# Patient Record
Sex: Male | Born: 1955 | Race: White | Hispanic: No | Marital: Married | Smoking: Former smoker
Health system: Southern US, Community
[De-identification: ages and names within clinical notes are randomized; demographics above are authoritative.]

## PROBLEM LIST (undated history)

## (undated) DIAGNOSIS — M1990 Unspecified osteoarthritis, unspecified site: Secondary | ICD-10-CM

## (undated) DIAGNOSIS — E119 Type 2 diabetes mellitus without complications: Secondary | ICD-10-CM

## (undated) DIAGNOSIS — E781 Pure hyperglyceridemia: Secondary | ICD-10-CM

## (undated) DIAGNOSIS — Z87442 Personal history of urinary calculi: Secondary | ICD-10-CM

## (undated) HISTORY — DX: Pure hyperglyceridemia: E78.1

## (undated) HISTORY — DX: Unspecified osteoarthritis, unspecified site: M19.90

## (undated) HISTORY — DX: Personal history of urinary calculi: Z87.442

## (undated) HISTORY — DX: Type 2 diabetes mellitus without complications: E11.9

## (undated) HISTORY — PX: KIDNEY STONE SURGERY: SHX686

---

## 2016-07-19 DIAGNOSIS — E781 Pure hyperglyceridemia: Secondary | ICD-10-CM

## 2016-07-19 HISTORY — DX: Pure hyperglyceridemia: E78.1

## 2016-07-31 ENCOUNTER — Ambulatory Visit (INDEPENDENT_AMBULATORY_CARE_PROVIDER_SITE_OTHER): Payer: BLUE CROSS/BLUE SHIELD | Admitting: Family Medicine

## 2016-07-31 ENCOUNTER — Encounter: Payer: Self-pay | Admitting: Family Medicine

## 2016-07-31 VITALS — BP 129/86 | HR 67 | Temp 98.1°F | Resp 16 | Ht 71.0 in | Wt 215.5 lb

## 2016-07-31 DIAGNOSIS — Z0000 Encounter for general adult medical examination without abnormal findings: Secondary | ICD-10-CM

## 2016-07-31 DIAGNOSIS — Z1159 Encounter for screening for other viral diseases: Secondary | ICD-10-CM

## 2016-07-31 DIAGNOSIS — Z125 Encounter for screening for malignant neoplasm of prostate: Secondary | ICD-10-CM

## 2016-07-31 DIAGNOSIS — Z1211 Encounter for screening for malignant neoplasm of colon: Secondary | ICD-10-CM | POA: Diagnosis not present

## 2016-07-31 LAB — COMPREHENSIVE METABOLIC PANEL
ALT: 26 U/L (ref 0–53)
AST: 16 U/L (ref 0–37)
Albumin: 4.5 g/dL (ref 3.5–5.2)
Alkaline Phosphatase: 83 U/L (ref 39–117)
BILIRUBIN TOTAL: 0.6 mg/dL (ref 0.2–1.2)
BUN: 19 mg/dL (ref 6–23)
CO2: 28 meq/L (ref 19–32)
CREATININE: 0.87 mg/dL (ref 0.40–1.50)
Calcium: 9.7 mg/dL (ref 8.4–10.5)
Chloride: 102 mEq/L (ref 96–112)
GFR: 94.67 mL/min (ref >60.00)
GLUCOSE: 120 mg/dL — AB (ref 70–99)
Potassium: 4.2 mEq/L (ref 3.5–5.1)
SODIUM: 138 meq/L (ref 135–145)
Total Protein: 7.2 g/dL (ref 6.0–8.3)

## 2016-07-31 LAB — CBC WITH DIFFERENTIAL/PLATELET
BASOS ABS: 0 10*3/uL (ref 0.0–0.1)
Basophils Relative: 0.6 % (ref 0.0–3.0)
EOS ABS: 0.1 10*3/uL (ref 0.0–0.7)
Eosinophils Relative: 2.2 % (ref 0.0–5.0)
HEMATOCRIT: 46.8 % (ref 39.0–52.0)
Hemoglobin: 16.1 g/dL (ref 13.0–17.0)
LYMPHS PCT: 32.1 % (ref 12.0–46.0)
Lymphs Abs: 1.9 10*3/uL (ref 0.7–4.0)
MCHC: 34.4 g/dL (ref 30.0–36.0)
MCV: 89.7 fl (ref 78.0–100.0)
MONO ABS: 0.5 10*3/uL (ref 0.1–1.0)
Monocytes Relative: 9.2 % (ref 3.0–12.0)
NEUTROS ABS: 3.3 10*3/uL (ref 1.4–7.7)
NEUTROS PCT: 55.9 % (ref 43.0–77.0)
PLATELETS: 192 10*3/uL (ref 150.0–400.0)
RBC: 5.21 Mil/uL (ref 4.22–5.81)
RDW: 13.1 % (ref 11.5–15.5)
WBC: 5.9 10*3/uL (ref 4.0–10.5)

## 2016-07-31 LAB — LIPID PANEL
CHOL/HDL RATIO: 4
Cholesterol: 191 mg/dL (ref 0–200)
HDL: 48.8 mg/dL (ref >39.00)
NONHDL: 142.06
Triglycerides: 255 mg/dL — ABNORMAL HIGH (ref 0.0–149.0)
VLDL: 51 mg/dL — ABNORMAL HIGH (ref 0.0–40.0)

## 2016-07-31 LAB — LDL CHOLESTEROL, DIRECT: LDL DIRECT: 96 mg/dL

## 2016-07-31 LAB — PSA: PSA: 0.6 ng/mL (ref 0.10–4.00)

## 2016-07-31 LAB — TSH: TSH: 2.22 u[IU]/mL (ref 0.35–4.50)

## 2016-07-31 NOTE — Progress Notes (Signed)
Pre visit review using our clinic review tool, if applicable. No additional management support is needed unless otherwise documented below in the visit note. 

## 2016-07-31 NOTE — Progress Notes (Signed)
Office Note 07/31/2016  CC:  Chief Complaint  Patient presents with  . Establish Care    pt moved to [facility] in May of 2017, last PCP was Dr. Jae Dire in [place], [place]  . Annual Exam    Pt is fasting.     HPI:  Joseph Cummings is a 61 y.o. White male who is here to establish care. Patient's most recent primary MD: Dr. Jae Dire in [place], [place]. Old records were not reviewed prior to or during today's visit.  No acute complaints.  Needs to get established with a local dentist.  Doesn't exercise much. Says diet is "pretty good".  Past Medical History:  Diagnosis Date  . Arthritis   . History of kidney stones     Past Surgical History:  Procedure Laterality Date  . KIDNEY STONE SURGERY     ureteroscopy with extraction approx 2015    Family History  Problem Relation Age of Onset  . Hyperlipidemia Mother   . Hypertension Mother   . Arthritis Father   . Hyperlipidemia Father   . Hypertension Father   . Heart disease Father   . Stroke Neg Hx   . Diabetes Neg Hx   . Cancer Neg Hx     Social History   Social History  . Marital status: Married    Spouse name: N/A  . Number of children: N/A  . Years of education: N/A   Occupational History  . Not on file.   Social History Main Topics  . Smoking status: Former Smoker    Packs/day: 1.00    Years: 10.00    Types: Cigarettes    Quit date: 06/19/1991  . Smokeless tobacco: Never Used  . Alcohol use Yes     Comment: occasionally  . Drug use: No  . Sexual activity: Not on file   Other Topics Concern  . Not on file   Social History Narrative   Married, 3 children.d   Educ: HS   Occup: retired Ecologist.     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   No tobacco (quit 25 yrs ago).  Alcohol: rare/occ.    Outpatient Encounter Prescriptions as of 07/31/2016  Medication Sig  . Omega-3 Fatty Acids (FISH OIL) 1000 MG CAPS Take 1 capsule by mouth daily.   No facility-administered encounter medications on file as of  07/31/2016.     Allergies  Allergen Reactions  . Sulfa Antibiotics Anaphylaxis    ROS Review of Systems  Constitutional: Negative for appetite change, chills, fatigue and fever.  HENT: Negative for congestion, dental problem, ear pain and sore throat.   Eyes: Negative for discharge, redness and visual disturbance.  Respiratory: Negative for cough, chest tightness, shortness of breath and wheezing.   Cardiovascular: Negative for chest pain, palpitations and leg swelling.  Gastrointestinal: Negative for abdominal pain, blood in stool, diarrhea, nausea and vomiting.  Genitourinary: Negative for difficulty urinating, dysuria, flank pain, frequency, hematuria and urgency.  Musculoskeletal: Negative for arthralgias, back pain, joint swelling, myalgias and neck stiffness.  Skin: Negative for pallor and rash.  Neurological: Negative for dizziness, speech difficulty, weakness and headaches.  Hematological: Negative for adenopathy. Does not bruise/bleed easily.  Psychiatric/Behavioral: Negative for confusion and sleep disturbance. The patient is not nervous/anxious.     PE; Blood pressure 129/86, pulse 67, temperature 98.1 F (36.7 C), temperature source Oral, resp. rate 16, height 5\' 11"  (1.803 m), weight 215 lb 8 oz (97.8 kg), SpO2 94 %. Gen: Alert, well appearing.  Patient is  oriented to person, place, time, and situation. AFFECT: pleasant, lucid thought and speech. ENT: Ears: EACs clear, normal epithelium.  TMs with good light reflex and landmarks bilaterally.  Eyes: no injection, icteris, swelling, or exudate.  EOMI, PERRLA. Nose: no drainage or turbinate edema/swelling.  No injection or focal lesion.  Mouth: lips without lesion/swelling.  Oral mucosa pink and moist.  Dentition intact and without obvious caries or gingival swelling.  Oropharynx without erythema, exudate, or swelling.  Neck: supple/nontender.  No LAD, mass, or TM.  Carotid pulses 2+ bilaterally, without bruits. CV: RRR, no  m/r/g.   LUNGS: CTA bilat, nonlabored resps, good aeration in all lung fields. ABD: soft, NT, ND, BS normal.  No hepatospenomegaly or mass.  No bruits. EXT: no clubbing, cyanosis, or edema.  Musculoskeletal: no joint swelling, erythema, warmth, or tenderness.  ROM of all joints intact. Skin - no sores or suspicious lesions or rashes or color changes Rectal exam: negative without mass, lesions or tenderness, PROSTATE EXAM: smooth and symmetric without nodules or tenderness.  Pertinent labs:  None today  ASSESSMENT AND PLAN:   New pt; obtain prior PCP records.  Health maintenance exam: Reviewed age and gender appropriate health maintenance issues (prudent diet, regular exercise, health risks of tobacco and excessive alcohol, use of seatbelts, fire alarms in home, use of sunscreen).  Also reviewed age and gender appropriate health screening as well as vaccine recommendations. He declined flu vaccine today.  Says last Td was 1 yr ago.  Zostavax rx given to pt today to take to pharmacy. Prostate: DRE normal, PSA drawn today. Colon cancer screening: pt declines colonoscopy and chose to do iFOB/Hemoccult ICT testing today.  An After Visit Summary was printed and given to the patient.  Return in about 1 year (around 07/31/2017) for annual CPE (fasting).  Signed:  Crissie Sickles, MD           07/31/2016

## 2016-08-01 ENCOUNTER — Encounter: Payer: Self-pay | Admitting: Family Medicine

## 2016-08-01 ENCOUNTER — Other Ambulatory Visit (INDEPENDENT_AMBULATORY_CARE_PROVIDER_SITE_OTHER): Payer: BLUE CROSS/BLUE SHIELD

## 2016-08-01 DIAGNOSIS — R7301 Impaired fasting glucose: Secondary | ICD-10-CM | POA: Diagnosis not present

## 2016-08-01 LAB — HEMOGLOBIN A1C: Hgb A1c MFr Bld: 6.1 % (ref 4.6–6.5)

## 2016-08-01 LAB — HEPATITIS C ANTIBODY: HCV Ab: NEGATIVE

## 2016-10-31 ENCOUNTER — Encounter: Payer: Self-pay | Admitting: Family Medicine

## 2016-10-31 ENCOUNTER — Ambulatory Visit (INDEPENDENT_AMBULATORY_CARE_PROVIDER_SITE_OTHER): Payer: 59 | Admitting: Family Medicine

## 2016-10-31 VITALS — BP 107/76 | HR 76 | Temp 99.0°F | Resp 16 | Ht 71.0 in | Wt 218.5 lb

## 2016-10-31 DIAGNOSIS — J209 Acute bronchitis, unspecified: Secondary | ICD-10-CM

## 2016-10-31 DIAGNOSIS — J069 Acute upper respiratory infection, unspecified: Secondary | ICD-10-CM | POA: Diagnosis not present

## 2016-10-31 MED ORDER — AZITHROMYCIN 250 MG PO TABS: ORAL_TABLET | ORAL | 0 refills | Status: DC

## 2016-10-31 MED ORDER — PREDNISONE 20 MG PO TABS: ORAL_TABLET | ORAL | 0 refills | Status: DC

## 2016-10-31 NOTE — Progress Notes (Signed)
OFFICE VISIT  10/31/2016   CC:  Chief Complaint  Patient presents with  . URI    x 3 days   HPI:    Patient is a 61 y.o.  male who presents for respiratory symptoms. Onset about 3 days ago, nasal congestion, runny nose, sinus pressure, PND, hacking cough. No SOB but says some chest rattle.  No fevers.  No body aches. Eating and drinking fine.   Past Medical History:  Diagnosis Date  . Arthritis   . History of kidney stones   . Hypertriglyceridemia 07/2016   mild  . Prediabetes 07/2016   Fasting gluc 120.  HbA1c 6.1%.    Past Surgical History:  Procedure Laterality Date  . KIDNEY STONE SURGERY     ureteroscopy with extraction approx 2015    Outpatient Medications Prior to Visit  Medication Sig Dispense Refill  . Omega-3 Fatty Acids (FISH OIL) 1000 MG CAPS Take 1 capsule by mouth daily.     No facility-administered medications prior to visit.     Allergies  Allergen Reactions  . Sulfa Antibiotics Anaphylaxis    ROS As per HPI  PE: Blood pressure 107/76, pulse 76, temperature 99 F (37.2 C), temperature source Oral, resp. rate 16, height 5\' 11"  (1.803 m), weight 218 lb 8 oz (99.1 kg), SpO2 95 %. VS: noted--normal. Gen: alert, NAD, NONTOXIC APPEARING. HEENT: eyes without injection, drainage, or swelling.  Ears: EACs clear, TMs with normal light reflex and landmarks.  Nose: Clear rhinorrhea, with some dried, crusty exudate adherent to mildly injected mucosa.  No purulent d/c.  No paranasal sinus TTP.  No facial swelling.  Throat and mouth without focal lesion.  No pharyngial swelling, erythema, or exudate.   Neck: supple, no LAD.   LUNGS: CTA bilat, nonlabored resps.  Some excessive coughing with deep inhalation and forced exhalation. CV: RRR, no m/r/g. EXT: no c/c/e SKIN: no rash    LABS:    Chemistry      Component Value Date/Time   NA 138 07/31/2016 1035   K 4.2 07/31/2016 1035   CL 102 07/31/2016 1035   CO2 28 07/31/2016 1035   BUN 19 07/31/2016  1035   CREATININE 0.87 07/31/2016 1035      Component Value Date/Time   CALCIUM 9.7 07/31/2016 1035   ALKPHOS 83 07/31/2016 1035   AST 16 07/31/2016 1035   ALT 26 07/31/2016 1035   BILITOT 0.6 07/31/2016 1035       IMPRESSION AND PLAN:  URI with acute bronchitis. Discussed the fact that this is likely viral, but due to pt's past hx of bad experience with this type of illness "going into chest too much and needing a Z-pack", I decided to rx z-pack and let him know of potential lack of efficacy if this is viral etiology AND he would be at risk for the potential risks of abx--he expressed understanding and wanted to have z-pack. I also rx'd prednisone 40mg  qd x 5d.  I recommended albuterol HFA but he says it has not helped for him in the past with this kind of illness.  An After Visit Summary was printed and given to the patient.  FOLLOW UP: Return if symptoms worsen or fail to improve.  Signed:  Crissie Sickles, MD           10/31/2016

## 2016-11-10 DIAGNOSIS — R229 Localized swelling, mass and lump, unspecified: Secondary | ICD-10-CM | POA: Diagnosis not present

## 2017-02-01 ENCOUNTER — Ambulatory Visit: Payer: 59 | Admitting: Family Medicine

## 2017-02-01 NOTE — Progress Notes (Deleted)
OFFICE VISIT  02/01/2017   CC: No chief complaint on file.    HPI:    Patient is a 61 y.o.  male who presents for 6 mo f/u prediabetes  Past Medical History:  Diagnosis Date  . Arthritis   . History of kidney stones   . Hypertriglyceridemia 07/2016   mild  . Prediabetes 07/2016   Fasting gluc 120.  HbA1c 6.1%.    Past Surgical History:  Procedure Laterality Date  . KIDNEY STONE SURGERY     ureteroscopy with extraction approx 2015    Outpatient Medications Prior to Visit  Medication Sig Dispense Refill  . azithromycin (ZITHROMAX) 250 MG tablet 2 tabs po qd x 1d, then 1 tab po qd x 4d 6 tablet 0  . Omega-3 Fatty Acids (FISH OIL) 1000 MG CAPS Take 1 capsule by mouth daily.    . predniSONE (DELTASONE) 20 MG tablet 2 tabs po qd x 5d 10 tablet 0   No facility-administered medications prior to visit.     Allergies  Allergen Reactions  . Sulfa Antibiotics Anaphylaxis    ROS As per HPI  PE: There were no vitals taken for this visit. ***  LABS:  Lab Results  Component Value Date   TSH 2.22 07/31/2016   Lab Results  Component Value Date   WBC 5.9 07/31/2016   HGB 16.1 07/31/2016   HCT 46.8 07/31/2016   MCV 89.7 07/31/2016   PLT 192.0 07/31/2016   Lab Results  Component Value Date   CREATININE 0.87 07/31/2016   BUN 19 07/31/2016   NA 138 07/31/2016   K 4.2 07/31/2016   CL 102 07/31/2016   CO2 28 07/31/2016   Lab Results  Component Value Date   ALT 26 07/31/2016   AST 16 07/31/2016   ALKPHOS 83 07/31/2016   BILITOT 0.6 07/31/2016   Lab Results  Component Value Date   CHOL 191 07/31/2016   Lab Results  Component Value Date   HDL 48.80 07/31/2016   No results found for: [facility] Lab Results  Component Value Date   TRIG 255.0 (H) 07/31/2016   Lab Results  Component Value Date   CHOLHDL 4 07/31/2016   Lab Results  Component Value Date   PSA 0.60 07/31/2016   Lab Results  Component Value Date   HGBA1C 6.1 08/01/2016   IMPRESSION AND  PLAN:  No problem-specific Assessment & Plan notes found for this encounter.  Hba1c, gluc + lipid if fasting  An After Visit Summary was printed and given to the patient.  FOLLOW UP: No Follow-up on file.  Signed:  Crissie Sickles, MD           02/01/2017

## 2017-03-31 DIAGNOSIS — T07XXXA Unspecified multiple injuries, initial encounter: Secondary | ICD-10-CM | POA: Diagnosis not present

## 2017-08-01 ENCOUNTER — Encounter: Payer: BLUE CROSS/BLUE SHIELD | Admitting: Family Medicine

## 2017-08-07 DIAGNOSIS — S29012A Strain of muscle and tendon of back wall of thorax, initial encounter: Secondary | ICD-10-CM | POA: Diagnosis not present

## 2017-08-07 DIAGNOSIS — M62838 Other muscle spasm: Secondary | ICD-10-CM | POA: Diagnosis not present

## 2018-01-16 DIAGNOSIS — E119 Type 2 diabetes mellitus without complications: Secondary | ICD-10-CM

## 2018-01-16 HISTORY — DX: Type 2 diabetes mellitus without complications: E11.9

## 2018-01-17 ENCOUNTER — Encounter: Payer: Self-pay | Admitting: Family Medicine

## 2018-01-17 ENCOUNTER — Ambulatory Visit (INDEPENDENT_AMBULATORY_CARE_PROVIDER_SITE_OTHER): Payer: 59 | Admitting: Family Medicine

## 2018-01-17 VITALS — BP 142/84 | HR 64 | Temp 97.7°F | Resp 16 | Ht 71.0 in | Wt 219.0 lb

## 2018-01-17 DIAGNOSIS — Z1211 Encounter for screening for malignant neoplasm of colon: Secondary | ICD-10-CM

## 2018-01-17 DIAGNOSIS — Z0000 Encounter for general adult medical examination without abnormal findings: Secondary | ICD-10-CM | POA: Diagnosis not present

## 2018-01-17 DIAGNOSIS — R7303 Prediabetes: Secondary | ICD-10-CM

## 2018-01-17 DIAGNOSIS — Z125 Encounter for screening for malignant neoplasm of prostate: Secondary | ICD-10-CM | POA: Diagnosis not present

## 2018-01-17 DIAGNOSIS — E781 Pure hyperglyceridemia: Secondary | ICD-10-CM

## 2018-01-17 LAB — CBC WITH DIFFERENTIAL/PLATELET
Basophils Absolute: 0 10*3/uL (ref 0.0–0.1)
Basophils Relative: 0.5 % (ref 0.0–3.0)
Eosinophils Absolute: 0.1 10*3/uL (ref 0.0–0.7)
Eosinophils Relative: 1.1 % (ref 0.0–5.0)
HEMATOCRIT: 46.5 % (ref 39.0–52.0)
HEMOGLOBIN: 16.1 g/dL (ref 13.0–17.0)
LYMPHS PCT: 27.4 % (ref 12.0–46.0)
Lymphs Abs: 1.8 10*3/uL (ref 0.7–4.0)
MCHC: 34.7 g/dL (ref 30.0–36.0)
MCV: 90.2 fl (ref 78.0–100.0)
MONOS PCT: 9.8 % (ref 3.0–12.0)
Monocytes Absolute: 0.6 10*3/uL (ref 0.1–1.0)
NEUTROS ABS: 4 10*3/uL (ref 1.4–7.7)
Neutrophils Relative %: 61.2 % (ref 43.0–77.0)
PLATELETS: 190 10*3/uL (ref 150.0–400.0)
RBC: 5.16 Mil/uL (ref 4.22–5.81)
RDW: 13.2 % (ref 11.5–15.5)
WBC: 6.6 10*3/uL (ref 4.0–10.5)

## 2018-01-17 LAB — COMPREHENSIVE METABOLIC PANEL WITH GFR
ALT: 25 U/L (ref 0–53)
AST: 17 U/L (ref 0–37)
Albumin: 4.6 g/dL (ref 3.5–5.2)
Alkaline Phosphatase: 70 U/L (ref 39–117)
BUN: 12 mg/dL (ref 6–23)
CO2: 28 meq/L (ref 19–32)
Calcium: 9.7 mg/dL (ref 8.4–10.5)
Chloride: 103 meq/L (ref 96–112)
Creatinine, Ser: 0.95 mg/dL (ref 0.40–1.50)
GFR: 85.13 mL/min
Glucose, Bld: 101 mg/dL — ABNORMAL HIGH (ref 70–99)
Potassium: 4 meq/L (ref 3.5–5.1)
Sodium: 139 meq/L (ref 135–145)
Total Bilirubin: 1.1 mg/dL (ref 0.2–1.2)
Total Protein: 7.3 g/dL (ref 6.0–8.3)

## 2018-01-17 LAB — LDL CHOLESTEROL, DIRECT: Direct LDL: 85 mg/dL

## 2018-01-17 LAB — LIPID PANEL
Cholesterol: 161 mg/dL (ref 0–200)
HDL: 52.4 mg/dL
NonHDL: 108.39
Total CHOL/HDL Ratio: 3
Triglycerides: 206 mg/dL — ABNORMAL HIGH (ref 0.0–149.0)
VLDL: 41.2 mg/dL — ABNORMAL HIGH (ref 0.0–40.0)

## 2018-01-17 LAB — HEMOGLOBIN A1C: Hgb A1c MFr Bld: 6.5 % (ref 4.6–6.5)

## 2018-01-17 LAB — TSH: TSH: 1.21 u[IU]/mL (ref 0.35–4.50)

## 2018-01-17 LAB — PSA: PSA: 0.37 ng/mL (ref 0.10–4.00)

## 2018-01-17 NOTE — Patient Instructions (Signed)

## 2018-01-17 NOTE — Progress Notes (Signed)
Office Note 01/17/2018  CC:  Chief Complaint  Patient presents with  . Annual Exam    HPI:  Joseph Cummings is a 62 y.o. White male who is here for annual health maintenance exam. Doing well.  Exercising: walking, sit ups, push ups. Diet: pretty healthy. Dental: > 43yr   Past Medical History:  Diagnosis Date  . Arthritis   . History of kidney stones   . Hypertriglyceridemia 07/2016   mild  . Prediabetes 07/2016   Fasting gluc 120.  HbA1c 6.1%.    Past Surgical History:  Procedure Laterality Date  . KIDNEY STONE SURGERY     ureteroscopy with extraction approx 2015    Family History  Problem Relation Age of Onset  . Hyperlipidemia Mother   . Hypertension Mother   . Arthritis Father   . Hyperlipidemia Father   . Hypertension Father   . Heart disease Father   . Stroke Neg Hx   . Diabetes Neg Hx   . Cancer Neg Hx     Social History   Socioeconomic History  . Marital status: Married    Spouse name: Not on file  . Number of children: Not on file  . Years of education: Not on file  . Highest education level: Not on file  Occupational History  . Not on file  Social Needs  . Financial resource strain: Not on file  . Food insecurity:    Worry: Not on file    Inability: Not on file  . Transportation needs:    Medical: Not on file    Non-medical: Not on file  Tobacco Use  . Smoking status: Former Smoker    Packs/day: 1.00    Years: 10.00    Pack years: 10.00    Types: Cigarettes    Last attempt to quit: 06/19/1991    Years since quitting: 26.6  . Smokeless tobacco: Never Used  Substance and Sexual Activity  . Alcohol use: Yes    Comment: occasionally  . Drug use: No  . Sexual activity: Not on file  Lifestyle  . Physical activity:    Days per week: Not on file    Minutes per session: Not on file  . Stress: Not on file  Relationships  . Social connections:    Talks on phone: Not on file    Gets together: Not on file    Attends religious service:  Not on file    Active member of club or organization: Not on file    Attends meetings of clubs or organizations: Not on file    Relationship status: Not on file  . Intimate partner violence:    Fear of current or ex partner: Not on file    Emotionally abused: Not on file    Physically abused: Not on file    Forced sexual activity: Not on file  Other Topics Concern  . Not on file  Social History Narrative   Married, 3 children.d   Educ: HS   Occup: retired Ecologist.     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   No tobacco (quit 25 yrs ago).  Alcohol: rare/occ.    Outpatient Medications Prior to Visit  Medication Sig Dispense Refill  . omega-3 acid ethyl esters (LOVAZA) 1 g capsule Take by mouth.    . Omega-3 Fatty Acids (FISH OIL) 1000 MG CAPS Take 1 capsule by mouth daily.    Marland Kitchen pyridOXINE (B-6) 50 MG tablet Take by mouth.    . Zinc Citrate-Phytase (ZYTAZE)  25-500 MG CAPS Take by mouth.    . predniSONE (DELTASONE) 20 MG tablet 2 tabs po qd x 5d (Patient not taking: Reported on 01/17/2018) 10 tablet 0  . azithromycin (ZITHROMAX) 250 MG tablet 2 tabs po qd x 1d, then 1 tab po qd x 4d 6 tablet 0   No facility-administered medications prior to visit.     Allergies  Allergen Reactions  . Sulfa Antibiotics Anaphylaxis    ROS Review of Systems  Constitutional: Negative for appetite change, chills, fatigue and fever.  HENT: Negative for congestion, dental problem, ear pain and sore throat.   Eyes: Negative for discharge, redness and visual disturbance.  Respiratory: Negative for cough, chest tightness, shortness of breath and wheezing.   Cardiovascular: Negative for chest pain, palpitations and leg swelling.  Gastrointestinal: Negative for abdominal pain, blood in stool, diarrhea, nausea and vomiting.  Genitourinary: Negative for difficulty urinating, dysuria, flank pain, frequency, hematuria and urgency.  Musculoskeletal: Negative for arthralgias, back pain, joint swelling, myalgias  and neck stiffness.  Skin: Negative for pallor and rash.  Neurological: Negative for dizziness, speech difficulty, weakness and headaches.  Hematological: Negative for adenopathy. Does not bruise/bleed easily.  Psychiatric/Behavioral: Negative for confusion and sleep disturbance. The patient is not nervous/anxious.     PE; Blood pressure (!) 142/84, pulse 64, temperature 97.7 F (36.5 C), temperature source Oral, resp. rate 16, height 5\' 11"  (1.803 m), weight 219 lb (99.3 kg), SpO2 96 %. Gen: Alert, well appearing.  Patient is oriented to person, place, time, and situation. AFFECT: pleasant, lucid thought and speech. ENT: Ears: EACs clear, normal epithelium.  TMs with good light reflex and landmarks bilaterally.  Eyes: no injection, icteris, swelling, or exudate.  EOMI, PERRLA. Nose: no drainage or turbinate edema/swelling.  No injection or focal lesion.  Mouth: lips without lesion/swelling.  Oral mucosa pink and moist.  Dentition intact and without obvious caries or gingival swelling.  Oropharynx without erythema, exudate, or swelling.  Neck: supple/nontender.  No LAD, mass, or TM.  Carotid pulses 2+ bilaterally, without bruits. CV: RRR, no m/r/g.   LUNGS: CTA bilat, nonlabored resps, good aeration in all lung fields. ABD: soft, NT, ND, BS normal.  No hepatospenomegaly or mass.  No bruits. EXT: no clubbing, cyanosis, or edema.  Musculoskeletal: no joint swelling, erythema, warmth, or tenderness.  ROM of all joints intact. Skin - no sores or suspicious lesions or rashes or color changes Rectal exam: negative without mass, lesions or tenderness, PROSTATE EXAM: smooth and symmetric without nodules or tenderness.   Pertinent labs:  Lab Results  Component Value Date   TSH 2.22 07/31/2016   Lab Results  Component Value Date   WBC 5.9 07/31/2016   HGB 16.1 07/31/2016   HCT 46.8 07/31/2016   MCV 89.7 07/31/2016   PLT 192.0 07/31/2016   Lab Results  Component Value Date   CREATININE  0.87 07/31/2016   BUN 19 07/31/2016   NA 138 07/31/2016   K 4.2 07/31/2016   CL 102 07/31/2016   CO2 28 07/31/2016   Lab Results  Component Value Date   ALT 26 07/31/2016   AST 16 07/31/2016   ALKPHOS 83 07/31/2016   BILITOT 0.6 07/31/2016   Lab Results  Component Value Date   CHOL 191 07/31/2016   Lab Results  Component Value Date   HDL 48.80 07/31/2016   No results found for: [facility] Lab Results  Component Value Date   TRIG 255.0 (H) 07/31/2016   Lab Results  Component  Value Date   CHOLHDL 4 07/31/2016   Lab Results  Component Value Date   PSA 0.60 07/31/2016   Lab Results  Component Value Date   HGBA1C 6.1 08/01/2016    ASSESSMENT AND PLAN:   Health maintenance exam: Reviewed age and gender appropriate health maintenance issues (prudent diet, regular exercise, health risks of tobacco and excessive alcohol, use of seatbelts, fire alarms in home, use of sunscreen).  Also reviewed age and gender appropriate health screening as well as vaccine recommendations. Vaccines: Tdap.  Shingrix: he has had #1 and is working on getting #2 with his pharmacy. Labs: fasting HP, A1c (prediabetes), PSA. Prostate ca screening: DRE normal today , PSA. Colon ca screening: pt declined colonoscopy last CPE, opted for iFOB but he never returned a sample for testing. Ordered iFOB again today.  An After Visit Summary was printed and given to the patient.  FOLLOW UP:  Return in about 1 year (around 01/18/2019) for annual CPE (fasting).  Signed:  Crissie Sickles, MD           01/17/2018

## 2018-01-19 ENCOUNTER — Encounter: Payer: Self-pay | Admitting: Family Medicine

## 2018-01-20 ENCOUNTER — Encounter: Payer: Self-pay | Admitting: Family Medicine

## 2018-06-05 ENCOUNTER — Ambulatory Visit (INDEPENDENT_AMBULATORY_CARE_PROVIDER_SITE_OTHER): Payer: 59 | Admitting: Allergy

## 2018-06-05 ENCOUNTER — Encounter: Payer: Self-pay | Admitting: Allergy

## 2018-06-05 VITALS — BP 142/96 | HR 70 | Resp 16 | Ht 71.0 in | Wt 224.0 lb

## 2018-06-05 DIAGNOSIS — L299 Pruritus, unspecified: Secondary | ICD-10-CM | POA: Insufficient documentation

## 2018-06-05 DIAGNOSIS — R21 Rash and other nonspecific skin eruption: Secondary | ICD-10-CM | POA: Insufficient documentation

## 2018-06-05 MED ORDER — HYDROXYZINE HCL 25 MG PO TABS: 25.0000 mg | ORAL_TABLET | Freq: Every evening | ORAL | 2 refills | Status: AC | PRN

## 2018-06-05 MED ORDER — PREDNISONE 10 MG PO TABS: ORAL_TABLET | ORAL | 0 refills | Status: DC

## 2018-06-05 MED ORDER — TRIAMCINOLONE ACETONIDE 0.1 % EX OINT: 1.0000 "application " | TOPICAL_OINTMENT | Freq: Two times a day (BID) | CUTANEOUS | 1 refills | Status: DC

## 2018-06-05 NOTE — Assessment & Plan Note (Signed)
Pruritus and rash for the past 3 weeks which is not improving despite allegra and benadryl use. Patient started on new tumeric medication during this time. Denies any other changes in diet, medications, or personal care products. He currently has a head cold but no infections at the time of rash onset. 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 This apparently cleared up with prednisone.  Unable to skin test today due to recent antihistamine intake.  Given clinical history there's concern regarding the tumeric however some of the rashes have a dry, whitish, plaque like look to them which is not consistent with an allergic rash.  Get bloodwork as below (recent TSH and CMP were unremarkable).  Start prednisone taper.  Prednisone 40mg  daily x 2 days, 30mg  daily x 2 days, 20mg  daily x 2 days, 10mg  daily x 2 days  Take zyrtec 20mg  in the morning as long as it does not cause drowsiness.   Take hydroxyzine 25mg  1 hour before bedtime as needed for itching.  May use triamcinolone 0.1% ointment twice a day for skin flares up to 3 weeks at a time. Use only below the neck.  Do not take turmeric.  Discussed proper skin care measures.   If rash is persistent and bloodwork unremarkable discussed that he may need to be referred to dermatology for possible skin biopsy.

## 2018-06-05 NOTE — Assessment & Plan Note (Signed)
.   See assessment and plan as above. 

## 2018-06-05 NOTE — Progress Notes (Signed)
New Patient Note  RE: Joseph Cummings MRN: [id number] DOB: [birth year] Date of Office Visit: 06/05/2018  Referring provider: No ref. provider found Primary care provider: Tammi Sou, MD  Chief Complaint: Rash (contact allergy, dust, cats, dogs, grass x 20 years, polyesters ) and Pruritis  History of Present Illness: I had the pleasure of seeing Joseph Cummings for initial evaluation at the [facility] [place] on 06/05/2018. He is a 62 y.o. male, who is self-referred here for the evaluation of rash and pruritus.   About 3 weeks ago he started with slight itching of his skin and it has been progressively worsening with red rashes. He changed his furnace filter thinking it was exposure to dust but then he realized that he also started taking turmeric around the same time. He stopped the turmeric 2-3 days ago and the rash/pruritus has not improved. He has been taking allegra and benadryl with minimal benefit.  Denies any fevers, chills, changes in foods, personal care products or recent infections. He has tried the following therapies: antihistamines with minimal benefit. Systemic steroids not during this episode.  He does eat red med once a week but no recent tick bites. Previous work up includes: no recent bloodwork. Previous history of rash/hives: yes. 18 years ago patient was diagnosed with a contact allergy. He has similar rash as above but then it turned into blisters and rash. apparently it cleared up after prednisone. He had skin testing 18 years ago which was positive to cats, dogs, dust mites and polyester per patient report. He was started on allegra and stopped taking daily allegra a few years ago as his symptoms were well controlled.   He does not get the typical rhino conjunctivitis symptoms with environmental exposure to pet dander.  Patient is up to date with the following cancer screening tests: physical exam but not up to date with colonoscopy.  Of note, he  developed a head cold a few days ago and has been taking alka-seltzer.   Assessment and Plan: Doil is a 62 y.o. male with: Rash and other nonspecific skin eruption Pruritus and rash for the past 3 weeks which is not improving despite allegra and benadryl use. Patient started on new tumeric medication during this time. Denies any other changes in diet, medications, or personal care products. He currently has a head cold but no infections at the time of rash onset. 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 This apparently cleared up with prednisone.  Unable to skin test today due to recent antihistamine intake.  Given clinical history there's concern regarding the tumeric however some of the rashes have a dry, whitish, plaque like look to them which is not consistent with an allergic rash.  Get bloodwork as below (recent TSH and CMP were unremarkable).  Start prednisone taper.  Prednisone 40mg  daily x 2 days, 30mg  daily x 2 days, 20mg  daily x 2 days, 10mg  daily x 2 days  Take zyrtec 20mg  in the morning as long as it does not cause drowsiness.   Take hydroxyzine 25mg  1 hour before bedtime as needed for itching.  May use triamcinolone 0.1% ointment twice a day for skin flares up to 3 weeks at a time. Use only below the neck.  Do not take turmeric.  Discussed proper skin care measures.   If rash is persistent and bloodwork unremarkable discussed that he may need to  be referred to dermatology for possible skin biopsy.  Pruritus See assessment and plan as above.  Return in about 4 weeks (around 07/03/2018).  Meds ordered this encounter  Medications  . hydrOXYzine (ATARAX/VISTARIL) 25 MG tablet    Sig: Take 1 tablet (25 mg total) by mouth at bedtime as needed for itching. 1 hour before bedtime    Dispense:  30 tablet    Refill:  2  . triamcinolone ointment (KENALOG) 0.1 %    Sig: Apply 1  application topically 2 (two) times daily. Below the neck up to 3 weeks at a time.    Dispense:  30 g    Refill:  1  . predniSONE (DELTASONE) 10 MG tablet    Sig: 40mg  daily x 2 days, 30mg  daily x 2 days, 20mg  daily x 2 days, 10mg  daily x 2 days    Dispense:  20 tablet    Refill:  0    Lab Orders     CBC with Differential/Platelet     ANA w/Reflex     Tryptase     Alpha-Gal Panel     Allergens w/Total IgE Area 2  Other allergy screening: Asthma: no Rhino conjunctivitis: no Food allergy: no Medication allergy: yes  Sulfa - topical cream caused respiratory distress Hymenoptera allergy: no Urticaria: no Eczema:no History of recurrent infections suggestive of immunodeficency: no  Diagnostics: Skin Testing: Deferred due to recent antihistamines use.  Past Medical History: Patient Active Problem List   Diagnosis Date Noted  . Pruritus 06/05/2018  . Rash and other nonspecific skin eruption 06/05/2018   Past Medical History:  Diagnosis Date  . Arthritis   . Diabetes mellitus ([place]) 01/2018   07/2016 PREDIABETES: Fasting gluc 120.  HbA1c 6.1%.  Then A1c 6.5% 01/2018:  pt declined nutritionist referral and my recommendation to start metformin 01/20/18.  Marland Kitchen History of kidney stones   . Hypertriglyceridemia 07/2016   mild   Past Surgical History: Past Surgical History:  Procedure Laterality Date  . KIDNEY STONE SURGERY     ureteroscopy with extraction approx 2015   Medication List:  Current Outpatient Medications  Medication Sig Dispense Refill  . Omega-3 Fatty Acids (FISH OIL) 1000 MG CAPS Take 1 capsule by mouth daily.    Marland Kitchen pyridOXINE (B-6) 50 MG tablet Take by mouth.    . Zinc Citrate-Phytase (ZYTAZE) 25-500 MG CAPS Take by mouth.    . hydrOXYzine (ATARAX/VISTARIL) 25 MG tablet Take 1 tablet (25 mg total) by mouth at bedtime as needed for itching. 1 hour before bedtime 30 tablet 2  . predniSONE (DELTASONE) 10 MG tablet 40mg  daily x 2 days, 30mg  daily x 2 days, 20mg  daily x  2 days, 10mg  daily x 2 days 20 tablet 0  . triamcinolone ointment (KENALOG) 0.1 % Apply 1 application topically 2 (two) times daily. Below the neck up to 3 weeks at a time. 30 g 1   No current facility-administered medications for this visit.    Allergies: Allergies  Allergen Reactions  . Sulfa Antibiotics Anaphylaxis   Social History: Social History   Socioeconomic History  . Marital status: Married    Spouse name: Not on file  . Number of children: Not on file  . Years of education: Not on file  . Highest education level: Not on file  Occupational History  . Not on file  Social Needs  . Financial resource strain: Not on file  . Food insecurity:    Worry: Not on file  Inability: Not on file  . Transportation needs:    Medical: Not on file    Non-medical: Not on file  Tobacco Use  . Smoking status: Former Smoker    Packs/day: 1.00    Years: 10.00    Pack years: 10.00    Types: Cigarettes    Last attempt to quit: 06/19/1991    Years since quitting: 26.9  . Smokeless tobacco: Never Used  Substance and Sexual Activity  . Alcohol use: Yes    Comment: occasionally  . Drug use: No  . Sexual activity: Not on file  Lifestyle  . Physical activity:    Days per week: Not on file    Minutes per session: Not on file  . Stress: Not on file  Relationships  . Social connections:    Talks on phone: Not on file    Gets together: Not on file    Attends religious service: Not on file    Active member of club or organization: Not on file    Attends meetings of clubs or organizations: Not on file    Relationship status: Not on file  Other Topics Concern  . Not on file  Social History Narrative   Married, 3 children.d   Educ: HS   Occup: retired Ecologist.     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   No tobacco (quit 25 yrs ago).  Alcohol: rare/occ.   Lives in a [address] year old duplex. Smoking: denies Occupation: Public house manager HistoryFreight forwarder in  the house: no Charity fundraiser in the family room: yes Carpet in the bedroom: yes Heating: electric Cooling: central Pet: no  Family History: Family History  Problem Relation Age of Onset  . Hyperlipidemia Mother   . Hypertension Mother   . Arthritis Father   . Hyperlipidemia Father   . Hypertension Father   . Heart disease Father   . Stroke Neg Hx   . Diabetes Neg Hx   . Cancer Neg Hx    Review of Systems  Constitutional: Negative for appetite change, chills, fever and unexpected weight change.  HENT: Negative for congestion and rhinorrhea.   Eyes: Negative for itching.  Respiratory: Negative for cough, chest tightness, shortness of breath and wheezing.   Cardiovascular: Negative for chest pain.  Gastrointestinal: Negative for abdominal pain.  Genitourinary: Negative for difficulty urinating.  Skin: Positive for rash.  Allergic/Immunologic: Positive for environmental allergies. Negative for food allergies.  Neurological: Negative for headaches.   Objective: BP (!) 142/96   Pulse 70   Resp 16   Ht 5\' 11"  (1.803 m)   Wt 224 lb (101.6 kg)   SpO2 97%   BMI 31.24 kg/m  Body mass index is 31.24 kg/m. Physical Exam  Constitutional: He is oriented to person, place, and time. He appears well-developed and well-nourished.  HENT:  Head: Normocephalic and atraumatic.  Right Ear: External ear normal.  Left Ear: External ear normal.  Nose: Nose normal.  Mouth/Throat: Oropharynx is clear and moist.  Eyes: Conjunctivae and EOM are normal.  Neck: Neck supple.  Cardiovascular: Normal rate, regular rhythm and normal heart sounds. Exam reveals no gallop and no friction rub.  No murmur heard. Pulmonary/Chest: Effort normal and breath sounds normal. He has no wheezes. He has no rales.  Abdominal: Soft. Bowel sounds are normal. There is no abdominal tenderness.  Lymphadenopathy:    He has no cervical adenopathy.  Neurological: He is alert and oriented to person, place, and time.  Skin:  Skin  is warm and dry. Rash noted.  Dry skin throughout. Right medial ankle area there is a patch of dry, scaly, whitish rash. The back has a few erythematous dry patches as well which is blanchable with some areas of macular papular rash.  Psychiatric: He has a normal mood and affect. His behavior is normal.  Nursing note and vitals reviewed.  The plan was reviewed with the patient/family, and all questions/concerned were addressed.    It was my pleasure to see Joseph Cummings today and participate in his care. Please feel free to contact me with any questions or concerns.  Sincerely,  Rexene Alberts, DO Allergy & Immunology  [facility] office: [phone] [facility] office: [phone]

## 2018-06-05 NOTE — Patient Instructions (Addendum)
Get bloodwork as below.  Start prednisone taper. Prednisone 40mg  daily x 2 days, 30mg  daily x 2 days, 20mg  daily x 2 days, 10mg  daily x 2 days  Take zyrtec 20mg  in the morning as long as it does not cause drowsiness.  Take hydroxyzine 25mg  1 hour before bedtime as needed for itching. May use triamcinolone 0.1% ointment twice a day for skin flares up to 3 weeks at a time. Use only below the neck.  Do not take turmeric.  Follow up as scheduled in January.   Skin care recommendations  Bath time: . Always use lukewarm water. AVOID very hot or cold water. Marland Kitchen Keep bathing time to 5-10 minutes. . Do NOT use bubble bath. . Use a mild soap and use just enough to wash the dirty areas. . Do NOT scrub skin vigorously.  . After bathing, pat dry your skin with a towel. Do NOT rub or scrub the skin.  Moisturizers and prescriptions:  . ALWAYS apply moisturizers immediately after bathing (within 3 minutes). This helps to lock-in moisture. . Use the moisturizer several times a day over the whole body. Kermit Balo summer moisturizers include: Aveeno, CeraVe, Cetaphil. Kermit Balo winter moisturizers include: Aquaphor, Vaseline, Cerave, Cetaphil, Eucerin, Vanicream. . When using moisturizers along with medications, the moisturizer should be applied about one hour after applying the medication to prevent diluting effect of the medication or moisturize around where you applied the medications. When not using medications, the moisturizer can be continued twice daily as maintenance.  Laundry and clothing: . Avoid laundry products with added color or perfumes. . Use unscented hypo-allergenic laundry products such as Tide free, Cheer free & gentle, and All free and clear.  . If the skin still seems dry or sensitive, you can try double-rinsing the clothes. . Avoid tight or scratchy clothing such as wool. . Do not use fabric softeners or dyer sheets.

## 2018-06-10 LAB — ALPHA-GAL PANEL
Beef (Bos spp) IgE: <0.1 kU/L (ref <0.35)
Class Interpretation: 0
LAMB CLASS INTERPRETATION: 0
PORK CLASS INTERPRETATION: 0
Pork (Sus spp) IgE: <0.1 kU/L (ref <0.35)

## 2018-06-10 LAB — ALLERGENS W/TOTAL IGE AREA 2
Alternaria Alternata IgE: <0.1 kU/L
Aspergillus Fumigatus IgE: <0.1 kU/L
Bermuda Grass IgE: <0.1 kU/L
Cedar, Mountain IgE: <0.1 kU/L
Cladosporium Herbarum IgE: <0.1 kU/L
Cockroach, German IgE: <0.1 kU/L
Cottonwood IgE: <0.1 kU/L
Dog Dander IgE: <0.1 kU/L
IgE (Immunoglobulin E), Serum: 14 IU/mL (ref 6–495)
Johnson Grass IgE: <0.1 kU/L
Pecan, Hickory IgE: <0.1 kU/L
Pigweed, Rough IgE: <0.1 kU/L
Ragweed, Short IgE: <0.1 kU/L
Timothy Grass IgE: <0.1 kU/L
White Mulberry IgE: <0.1 kU/L

## 2018-06-10 LAB — CBC WITH DIFFERENTIAL/PLATELET
Basophils Absolute: 0 10*3/uL (ref 0.0–0.2)
Basos: 1 %
EOS (ABSOLUTE): 0.2 10*3/uL (ref 0.0–0.4)
Eos: 5 %
HEMOGLOBIN: 14.9 g/dL (ref 13.0–17.7)
Hematocrit: 44.5 % (ref 37.5–51.0)
Immature Grans (Abs): 0 10*3/uL (ref 0.0–0.1)
Immature Granulocytes: 0 %
LYMPHS ABS: 1.2 10*3/uL (ref 0.7–3.1)
Lymphs: 25 %
MCH: 30.8 pg (ref 26.6–33.0)
MCHC: 33.5 g/dL (ref 31.5–35.7)
MCV: 92 fL (ref 79–97)
MONOCYTES: 10 %
MONOS ABS: 0.5 10*3/uL (ref 0.1–0.9)
NEUTROS ABS: 2.9 10*3/uL (ref 1.4–7.0)
Neutrophils: 59 %
Platelets: 170 10*3/uL (ref 150–450)
RBC: 4.83 x10E6/uL (ref 4.14–5.80)
RDW: 13.2 % (ref 12.3–15.4)
WBC: 4.9 10*3/uL (ref 3.4–10.8)

## 2018-06-10 LAB — TRYPTASE: Tryptase: 11.7 ug/L (ref 2.2–13.2)

## 2018-06-10 LAB — ANA W/REFLEX: ANA: NEGATIVE

## 2018-06-13 ENCOUNTER — Encounter: Payer: Self-pay | Admitting: Allergy

## 2018-06-27 ENCOUNTER — Other Ambulatory Visit: Payer: Self-pay | Admitting: *Deleted

## 2018-06-27 MED ORDER — PREDNISONE 10 MG PO TABS: ORAL_TABLET | ORAL | 0 refills | Status: DC

## 2018-06-27 MED ORDER — MONTELUKAST SODIUM 10 MG PO TABS: 10.0000 mg | ORAL_TABLET | Freq: Every day | ORAL | 5 refills | Status: DC

## 2018-06-27 MED ORDER — TRIAMCINOLONE ACETONIDE 0.1 % EX OINT: 1.0000 "application " | TOPICAL_OINTMENT | Freq: Two times a day (BID) | CUTANEOUS | 1 refills | Status: DC

## 2018-06-27 MED ORDER — FAMOTIDINE 20 MG PO TABS: 20.0000 mg | ORAL_TABLET | Freq: Two times a day (BID) | ORAL | 3 refills | Status: DC

## 2018-07-02 DIAGNOSIS — D229 Melanocytic nevi, unspecified: Secondary | ICD-10-CM | POA: Diagnosis not present

## 2018-07-02 DIAGNOSIS — L821 Other seborrheic keratosis: Secondary | ICD-10-CM | POA: Diagnosis not present

## 2018-07-02 DIAGNOSIS — L570 Actinic keratosis: Secondary | ICD-10-CM | POA: Diagnosis not present

## 2018-07-08 ENCOUNTER — Ambulatory Visit: Payer: Self-pay | Admitting: Allergy and Immunology

## 2018-07-11 ENCOUNTER — Ambulatory Visit: Payer: Self-pay | Admitting: Allergy

## 2018-08-01 ENCOUNTER — Encounter: Payer: Self-pay | Admitting: Family Medicine

## 2018-08-04 ENCOUNTER — Ambulatory Visit (INDEPENDENT_AMBULATORY_CARE_PROVIDER_SITE_OTHER): Payer: 59 | Admitting: Allergy

## 2018-08-04 ENCOUNTER — Encounter: Payer: Self-pay | Admitting: Allergy

## 2018-08-04 VITALS — BP 160/92 | HR 76 | Resp 16

## 2018-08-04 DIAGNOSIS — L299 Pruritus, unspecified: Secondary | ICD-10-CM

## 2018-08-04 DIAGNOSIS — R21 Rash and other nonspecific skin eruption: Secondary | ICD-10-CM

## 2018-08-04 MED ORDER — TRIAMCINOLONE ACETONIDE 0.1 % EX OINT: 1.0000 "application " | TOPICAL_OINTMENT | Freq: Two times a day (BID) | CUTANEOUS | 1 refills | Status: DC

## 2018-08-04 NOTE — Patient Instructions (Addendum)
Rash and other nonspecific skin eruption  Call dermatologist to see if they can biopsy the rash.  Start prednisone taper.  Take zyrtec (ceterizine) 10mg  twice a day as long as it does not cause drowsiness. May increase up to 20mg  twice a day.   Start pepcid (famotidine) 20mg  twice a day.   Start singulair (montelukast) 10mg  at night.   Take hydroxyzine 25mg  1 hour before bedtime as needed for itching.  May use triamcinolone 0.1% ointment twice a day for skin flares up to 3 weeks at a time. Use only below the neck.  Continue proper skin care measures.   Follow up in 4 weeks.

## 2018-08-04 NOTE — Progress Notes (Signed)
Follow Up Note  RE: Joseph Cummings MRN: [id number] DOB: 1955-10-05 Date of Office Visit: 08/04/2018  Referring provider: Tammi Sou, MD Primary care provider: Tammi Sou, MD  Chief Complaint: Rash  History of Present Illness: I had the pleasure of seeing Joseph Cummings for a follow up visit at the [facility] [place] on 08/04/2018. He is a 63 y.o. male, who is being followed for rash and pruritus. Today he is here for new complaint of persistent symptoms. His previous allergy office visit was on 06/05/2018 with Dr. Maudie Mercury.   Rash and other nonspecific skin eruption Patient had a flare which started Friday after work.   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 He did not take the Pepcid or Singulair as he was not sure if he should take those medications.  He does follow with a dermatologist for other reasons. No prior skin biopsy for this issue.  No history of eczema.   Assessment and Plan: Joseph Cummings is a 63 y.o. male with: Rash and other nonspecific skin eruption Past history - Pruritus and rash for the past 3 weeks which is not improving despite allegra and benadryl use. Patient started on new tumeric medication during this time. Denies any other changes in diet, medications, or personal care products. He currently has a head cold but no infections at the time of rash onset. 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 This apparently cleared up with prednisone. Interim history - was doing well up until last Friday when he had a flare. He was only taking allegra. Did not start Pepcid or Singulair or zyrtec. CBC diff, ANA, alpha gal, tryptase, environmental allergy panel was all unremarkable.   Call dermatologist to see if they can biopsy the rash. Will make additional recommendations  depending on pathology report.  Start prednisone taper after the biopsy.   Take zyrtec (cetrizine) 10mg  twice a day as long as it does not cause drowsiness. May increase up to 20mg  twice a day.   Start Pepcid (famotidine) 20mg  twice a day.   Start Singulair (montelukast) 10mg  at night.   Take hydroxyzine 25mg  1 hour before bedtime as needed for itching.  May use triamcinolone 0.1% ointment twice a day for skin flares up to 3 weeks at a time. Use only below the neck.  Continue proper skin care measures.   Return in about 4 weeks (around 09/01/2018).  Meds ordered this encounter  Medications  . triamcinolone ointment (KENALOG) 0.1 %    Sig: Apply 1 application topically 2 (two) times daily. Below the neck up to 3 weeks at a time.    Dispense:  453.6 g    Refill:  1   Diagnostics: None  Medication List:  Current Outpatient Medications  Medication Sig Dispense Refill  . hydrOXYzine (ATARAX/VISTARIL) 25 MG tablet Take 25 mg by mouth at bedtime.     . Omega-3 Fatty Acids (FISH OIL) 1000 MG CAPS Take 1 capsule by mouth daily.    Marland Kitchen pyridOXINE (B-6) 50 MG tablet Take by mouth.    . triamcinolone ointment (KENALOG) 0.1 % Apply 1 application topically 2 (two) times daily. Below the neck up to 3 weeks at a time. 453.6 g 1  . Zinc Citrate-Phytase (ZYTAZE) 25-500 MG CAPS Take by mouth.    Marland Kitchen  famotidine (PEPCID) 20 MG tablet Take 1 tablet (20 mg total) by mouth 2 (two) times daily. (Patient not taking: Reported on 08/04/2018) 60 tablet 3  . montelukast (SINGULAIR) 10 MG tablet Take 1 tablet (10 mg total) by mouth at bedtime. (Patient not taking: Reported on 08/04/2018) 30 tablet 5   No current facility-administered medications for this visit.    Allergies: Allergies  Allergen Reactions  . Sulfa Antibiotics Anaphylaxis   I reviewed his past medical history, social history, family history, and environmental history and no significant changes have been reported from previous visit on  06/05/2018.  Review of Systems  Constitutional: Negative for appetite change, chills, fever and unexpected weight change.  HENT: Negative for congestion and rhinorrhea.   Eyes: Negative for itching.  Respiratory: Negative for cough, chest tightness, shortness of breath and wheezing.   Cardiovascular: Negative for chest pain.  Gastrointestinal: Negative for abdominal pain.  Genitourinary: Negative for difficulty urinating.  Skin: Positive for rash.  Allergic/Immunologic: Negative for food allergies.  Neurological: Negative for headaches.   Objective: BP (!) 160/92   Pulse 76   Resp 16  There is no height or weight on file to calculate BMI. Physical Exam  Constitutional: He is oriented to person, place, and time. He appears well-developed and well-nourished.  HENT:  Head: Normocephalic and atraumatic.  Right Ear: External ear normal.  Left Ear: External ear normal.  Nose: Nose normal.  Mouth/Throat: Oropharynx is clear and moist.  Eyes: Conjunctivae and EOM are normal.  Neck: Neck supple.  Cardiovascular: Normal rate, regular rhythm and normal heart sounds. Exam reveals no gallop and no friction rub.  No murmur heard. Pulmonary/Chest: Effort normal and breath sounds normal. He has no wheezes. He has no rales.  Abdominal: Soft. Bowel sounds are normal. There is no abdominal tenderness.  Lymphadenopathy:    He has no cervical adenopathy.  Neurological: He is alert and oriented to person, place, and time.  Skin: Skin is warm and dry. Rash noted.  Dry skin throughout. The back has a few erythematous dry patches as well which is blanchable. Similar rash on lower extremities b/l.  Psychiatric: He has a normal mood and affect. His behavior is normal.  Nursing note and vitals reviewed.  Previous notes and tests were reviewed. The plan was reviewed with the patient/family, and all questions/concerned were addressed.  It was my pleasure to see Joseph Cummings today and participate in his care.  Please feel free to contact me with any questions or concerns.  Sincerely,  Rexene Alberts, DO Allergy & Immunology  [facility] office: [phone] [facility] office: [phone]

## 2018-08-04 NOTE — Assessment & Plan Note (Addendum)
Past history - Pruritus and rash for the past 3 weeks which is not improving despite allegra and benadryl use. Patient started on new tumeric medication during this time. Denies any other changes in diet, medications, or personal care products. He currently has a head cold but no infections at the time of rash onset. 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 This apparently cleared up with prednisone. Interim history - was doing well up until last Friday when he had a flare. He was only taking allegra. Did not start Pepcid or Singulair or zyrtec. CBC diff, ANA, alpha gal, tryptase, environmental allergy panel was all unremarkable.   Call dermatologist to see if they can biopsy the rash. Will make additional recommendations depending on pathology report.  Start prednisone taper after the biopsy.   Take zyrtec (cetrizine) 10mg  twice a day as long as it does not cause drowsiness. May increase up to 20mg  twice a day.   Start Pepcid (famotidine) 20mg  twice a day.   Start Singulair (montelukast) 10mg  at night.   Take hydroxyzine 25mg  1 hour before bedtime as needed for itching.  May use triamcinolone 0.1% ointment twice a day for skin flares up to 3 weeks at a time. Use only below the neck.  Continue proper skin care measures.

## 2018-08-05 DIAGNOSIS — L309 Dermatitis, unspecified: Secondary | ICD-10-CM | POA: Diagnosis not present

## 2019-04-08 ENCOUNTER — Telehealth: Payer: Self-pay | Admitting: Family Medicine

## 2019-04-08 NOTE — Telephone Encounter (Signed)
OK to work him in somewhere on my schedule tomorrow morning (maybe between the 8:30 and 9 oclock spots??).

## 2019-04-08 NOTE — Telephone Encounter (Signed)
Patient has had a head cold for 7 days. He feels like it has turned in to a sinus infection. He took [employer] which has given him a rash. Patient is requesting a virtual visit tomorrow before 11:00. Please advise.

## 2019-04-08 NOTE — Telephone Encounter (Signed)
Please work in for tomorrow morning.

## 2019-04-08 NOTE — Telephone Encounter (Signed)
No openings for tomorrow before 11. Please advise, thanks.

## 2019-04-09 ENCOUNTER — Encounter: Payer: Self-pay | Admitting: Family Medicine

## 2019-04-09 ENCOUNTER — Ambulatory Visit (INDEPENDENT_AMBULATORY_CARE_PROVIDER_SITE_OTHER): Payer: 59 | Admitting: Family Medicine

## 2019-04-09 ENCOUNTER — Other Ambulatory Visit: Payer: Self-pay

## 2019-04-09 VITALS — Temp 98.8°F

## 2019-04-09 DIAGNOSIS — R21 Rash and other nonspecific skin eruption: Secondary | ICD-10-CM

## 2019-04-09 DIAGNOSIS — R059 Cough, unspecified: Secondary | ICD-10-CM

## 2019-04-09 DIAGNOSIS — J019 Acute sinusitis, unspecified: Secondary | ICD-10-CM

## 2019-04-09 DIAGNOSIS — R05 Cough: Secondary | ICD-10-CM | POA: Diagnosis not present

## 2019-04-09 MED ORDER — AZITHROMYCIN 250 MG PO TABS: ORAL_TABLET | ORAL | 0 refills | Status: DC

## 2019-04-09 MED ORDER — PREDNISONE 10 MG PO TABS: ORAL_TABLET | ORAL | 0 refills | Status: DC

## 2019-04-09 NOTE — Telephone Encounter (Signed)
Patient called in this morning because he was told yesterday someone would be calling him back regarding appt with Dr. Anitra Lauth.  Scheduled work in appt per Dr. Anitra Lauth.  9:45AM - patient available.

## 2019-04-09 NOTE — Progress Notes (Signed)
Virtual Visit via Video Note  I connected with pt on 04/09/19 at  9:45 AM EDT by a video enabled telemedicine application and verified that I am speaking with the correct person using two identifiers.  Location patient: home Location provider:work or home office Persons participating in the virtual visit: patient, provider  I discussed the limitations of evaluation and management by telemedicine and the availability of in person appointments. The patient expressed understanding and agreed to proceed.  Telemedicine visit is a necessity given the COVID-19 restrictions in place at the current time.  HPI: 63 y/o WM being seen today for recent upper respiratory symptoms. Pt presents complaining of respiratory symptoms for 10  days.  Primary symptoms are: nasal congestion/runny nose.  Worst symptoms seems to be the congestion and now coughing a lot.  Lately the symptoms seem to be worsening. Pertinent negatives: No fevers, no wheezing, and no SOB.  No pain in face or teeth.  No significant HA.  ST mild at most.   Symptoms made worse by laying down.  Symptoms improved by nothing. Smoker? no Recent sick contact? none Muscle or joint aches? no Flu shot this season at least 2 wks ago? No  Onset of "all over, irritating and itchy" rash about a week ago, "exploded". Was taking alka seltzer for a few days at that time so he thinks it may have caused it. Hx of contact allergies/mold allergy, rash like this in the past had to have prednisone to clear. Says he has had this "exact" rash in the past 2 or so yrs ago and it resolved with a course of prednisone.  Additional ROS: no n/v/d or abdominal pain. No neck stiffness.   +Mild fatigue.  +Mild appetite loss.  ROS: See pertinent positives and negatives per HPI. Also, no swelling of lips, tongue, eyes/face.    Past Medical History:  Diagnosis Date  . Arthritis   . Diabetes mellitus ([place]) 01/2018   07/2016 PREDIABETES: Fasting gluc 120.  HbA1c 6.1%.   Then A1c 6.5% 01/2018:  pt declined nutritionist referral and my recommendation to start metformin 01/20/18.  Marland Kitchen History of kidney stones   . Hypertriglyceridemia 07/2016   mild    Past Surgical History:  Procedure Laterality Date  . KIDNEY STONE SURGERY     ureteroscopy with extraction approx 2015    Family History  Problem Relation Age of Onset  . Hyperlipidemia Mother   . Hypertension Mother   . Arthritis Father   . Hyperlipidemia Father   . Hypertension Father   . Heart disease Father   . Stroke Neg Hx   . Diabetes Neg Hx   . Cancer Neg Hx      Current Outpatient Medications:  .  Omega-3 Fatty Acids (FISH OIL) 1000 MG CAPS, Take 1 capsule by mouth daily., Disp: , Rfl:  .  pyridOXINE (B-6) 50 MG tablet, Take by mouth., Disp: , Rfl:  .  triamcinolone ointment (KENALOG) 0.1 %, Apply 1 application topically 2 (two) times daily. Below the neck up to 3 weeks at a time., Disp: 453.6 g, Rfl: 1 .  Zinc Citrate-Phytase (ZYTAZE) 25-500 MG CAPS, Take by mouth., Disp: , Rfl:  .  famotidine (PEPCID) 20 MG tablet, Take 1 tablet (20 mg total) by mouth 2 (two) times daily. (Patient not taking: Reported on 08/04/2018), Disp: 60 tablet, Rfl: 3 .  hydrOXYzine (ATARAX/VISTARIL) 25 MG tablet, Take 25 mg by mouth at bedtime. , Disp: , Rfl:  .  montelukast (SINGULAIR) 10 MG  tablet, Take 1 tablet (10 mg total) by mouth at bedtime. (Patient not taking: Reported on 08/04/2018), Disp: 30 tablet, Rfl: 5  EXAM:  VITALS per patient if applicable: Temp 123XX123 F (37.1 C) (Oral)    GENERAL: alert, oriented, appears well and in no acute distress I can see the rash on his back fairly well: small to medium sized splotches of pinkish macular rash, question of hives (could not see this in enough detail to tell). No ecchymoses, vesicles, or ulcerations.  HEENT: atraumatic, conjunttiva clear, no obvious abnormalities on inspection of external nose and ears  NECK: normal movements of the head and neck  LUNGS: on  inspection no signs of respiratory distress, breathing rate appears normal, no obvious gross SOB, gasping or wheezing  CV: no obvious cyanosis  MS: moves all visible extremities without noticeable abnormality  PSYCH/NEURO: pleasant and cooperative, no obvious depression or anxiety, speech and thought processing grossly intact  LABS: none    Chemistry      Component Value Date/Time   NA 139 01/17/2018 1326   K 4.0 01/17/2018 1326   CL 103 01/17/2018 1326   CO2 28 01/17/2018 1326   BUN 12 01/17/2018 1326   CREATININE 0.95 01/17/2018 1326      Component Value Date/Time   CALCIUM 9.7 01/17/2018 1326   ALKPHOS 70 01/17/2018 1326   AST 17 01/17/2018 1326   ALT 25 01/17/2018 1326   BILITOT 1.1 01/17/2018 1326     Lab Results  Component Value Date   HGBA1C 6.5 01/17/2018    ASSESSMENT AND PLAN:  Discussed the following assessment and plan:  1) Acute sinusitis with cough: z pack and continue otc symptomatic care. Covid quarantine recommendations reviewed. No covid testing necessary at this time.  2) Rash, itchy, acute. Unknown precipitant->viral exanthem vs allergic. Given his report of exact rash in the past getting better with steroid course I'll start this again and instructed him to come in for exam if it is not improving or if worsening/additional sx's in near future.  Prednisone 40mg  qd x 4d, then 30mg  qd x 4d, then 20mg  qd x 4d, then 10mg  qd x 4d. Nonsedating antihistamine qd until resolves.  I discussed the assessment and treatment plan with the patient. The patient was provided an opportunity to ask questions and all were answered. The patient agreed with the plan and demonstrated an understanding of the instructions.   The patient was advised to call back or seek an in-person evaluation if the symptoms worsen or if the condition fails to improve as anticipated.  F/u: prn   Signed:  Crissie Sickles, MD           04/09/2019

## 2019-09-02 ENCOUNTER — Encounter: Payer: Self-pay | Admitting: Family Medicine

## 2019-09-03 NOTE — Telephone Encounter (Signed)
Please advise if okay for work note, thanks.

## 2019-09-04 NOTE — Telephone Encounter (Signed)
OK for work note. Will you pls write this?

## 2019-11-22 ENCOUNTER — Emergency Department (HOSPITAL_COMMUNITY): Payer: 59

## 2019-11-22 ENCOUNTER — Encounter (HOSPITAL_BASED_OUTPATIENT_CLINIC_OR_DEPARTMENT_OTHER): Payer: Self-pay | Admitting: Emergency Medicine

## 2019-11-22 ENCOUNTER — Emergency Department (HOSPITAL_BASED_OUTPATIENT_CLINIC_OR_DEPARTMENT_OTHER)
Admission: EM | Admit: 2019-11-22 | Discharge: 2019-11-22 | Disposition: A | Discharge status: Home / Self Care | Payer: 59 | Attending: Emergency Medicine | Admitting: Emergency Medicine

## 2019-11-22 ENCOUNTER — Other Ambulatory Visit: Payer: Self-pay

## 2019-11-22 DIAGNOSIS — R42 Dizziness and giddiness: Secondary | ICD-10-CM | POA: Insufficient documentation

## 2019-11-22 DIAGNOSIS — R7303 Prediabetes: Secondary | ICD-10-CM | POA: Insufficient documentation

## 2019-11-22 DIAGNOSIS — Z79899 Other long term (current) drug therapy: Secondary | ICD-10-CM | POA: Insufficient documentation

## 2019-11-22 DIAGNOSIS — Z87891 Personal history of nicotine dependence: Secondary | ICD-10-CM | POA: Insufficient documentation

## 2019-11-22 LAB — CBC WITH DIFFERENTIAL/PLATELET
Abs Immature Granulocytes: 0.01 10*3/uL (ref 0.00–0.07)
Basophils Absolute: 0.1 10*3/uL (ref 0.0–0.1)
Basophils Relative: 1 %
Eosinophils Absolute: 0.1 10*3/uL (ref 0.0–0.5)
Eosinophils Relative: 2 %
HCT: 44.9 % (ref 39.0–52.0)
Hemoglobin: 15.5 g/dL (ref 13.0–17.0)
Immature Granulocytes: 0 %
Lymphocytes Relative: 29 %
Lymphs Abs: 1.5 10*3/uL (ref 0.7–4.0)
MCH: 31.1 pg (ref 26.0–34.0)
MCHC: 34.5 g/dL (ref 30.0–36.0)
MCV: 90.2 fL (ref 80.0–100.0)
Monocytes Absolute: 0.5 10*3/uL (ref 0.1–1.0)
Monocytes Relative: 11 %
Neutro Abs: 2.9 10*3/uL (ref 1.7–7.7)
Neutrophils Relative %: 57 %
Platelets: 162 10*3/uL (ref 150–400)
RBC: 4.98 MIL/uL (ref 4.22–5.81)
RDW: 12.6 % (ref 11.5–15.5)
WBC: 5.1 10*3/uL (ref 4.0–10.5)
nRBC: 0 % (ref 0.0–0.2)

## 2019-11-22 LAB — BASIC METABOLIC PANEL
Anion gap: 9 (ref 5–15)
BUN: 18 mg/dL (ref 8–23)
CO2: 23 mmol/L (ref 22–32)
Calcium: 8.8 mg/dL — ABNORMAL LOW (ref 8.9–10.3)
Chloride: 106 mmol/L (ref 98–111)
Creatinine, Ser: 1.05 mg/dL (ref 0.61–1.24)
GFR calc Af Amer: >60 mL/min (ref >60)
GFR calc non Af Amer: >60 mL/min (ref >60)
Glucose, Bld: 152 mg/dL — ABNORMAL HIGH (ref 70–99)
Potassium: 4.3 mmol/L (ref 3.5–5.1)
Sodium: 138 mmol/L (ref 135–145)

## 2019-11-22 LAB — CBG MONITORING, ED: Glucose-Capillary: 149 mg/dL — ABNORMAL HIGH (ref 70–99)

## 2019-11-22 MED ORDER — MECLIZINE HCL 25 MG PO TABS
25.0000 mg | ORAL_TABLET | Freq: Three times a day (TID) | ORAL | 0 refills | Status: DC
Start: 2019-11-22 — End: 2019-11-22

## 2019-11-22 MED ORDER — LORAZEPAM 0.5 MG PO TABS
0.5000 mg | ORAL_TABLET | Freq: Once | ORAL | Status: AC
  Administered 2019-11-22: 0.5 mg via ORAL
  Filled 2019-11-22: qty 1

## 2019-11-22 MED ORDER — MECLIZINE HCL 25 MG PO TABS
25.0000 mg | ORAL_TABLET | Freq: Three times a day (TID) | ORAL | 0 refills | Status: AC
Start: 2019-11-22 — End: 2019-11-29

## 2019-11-22 MED ORDER — LORAZEPAM 0.5 MG PO TABS
0.5000 mg | ORAL_TABLET | Freq: Three times a day (TID) | ORAL | 0 refills | Status: DC | PRN
Start: 2019-11-22 — End: 2023-10-18

## 2019-11-22 MED ORDER — MECLIZINE HCL 25 MG PO TABS
25.0000 mg | ORAL_TABLET | Freq: Once | ORAL | Status: AC
  Administered 2019-11-22: 25 mg via ORAL
  Filled 2019-11-22: qty 1

## 2019-11-22 NOTE — ED Notes (Signed)
Pt st's he feels much better after medication.  St's he can stand up and walk without getting dizzy at this time.  Dr. Vanita Panda made aware

## 2019-11-22 NOTE — ED Notes (Signed)
Patient transported to MRI 

## 2019-11-22 NOTE — ED Provider Notes (Signed)
3:07 PM Patient seen briefly on arrival to our facility from our affiliated site.  Patient is awake, alert, speaking clearly. Patient is going to MRI, will require evaluation on return.  7:16 PM Now, after her MRI results are available, patient has finally received Ativan, meclizine.  We discussed MRI results again, reassuring, no evidence for stroke, and now he feels substantially better, having received appropriate medication. Patient amenable to discharge, with follow-up with neurology for vertigo.   Carmin Muskrat, MD 11/22/19 [phone]

## 2019-11-22 NOTE — Discharge Instructions (Addendum)
As discussed, you are likely experiencing vertigo.  It is important to monitor your condition carefully and do not hesitate to return here if you develop new, or concerning changes.  Otherwise, please follow-up with our neurology colleagues.

## 2019-11-22 NOTE — ED Triage Notes (Signed)
Woke up with dizziness at 9am. Went to bed at midnight and felt normal. Denies weakness. Speech is clear.

## 2019-11-22 NOTE — ED Provider Notes (Signed)
[facility] EMERGENCY DEPARTMENT Provider Note   CSN: [id number] Arrival date & time: 11/22/19  1123     History Chief Complaint  Patient presents with  . Dizziness    Joseph Cummings is a 64 y.o. male w PMHx pre-diabetes, presenting to the emergency department with complaint of dizziness that began at 9 AM.  He states he woke up feeling fine.  He noticed the dizziness began when his phone rang.  He states he went to answer and began feeling very dizzy like the room was spinning.  He has associated nausea.  Since that time he has had constant dizziness that is made significantly worse with laying flat and other position changes.  He states it is a similar feeling to feeling "drunk or hung over."  He feels off balance when he is ambulating.  He tried to drink juice however this did not improve his symptoms.  He does not have a history of vertigo.  No recent illnesses.  Denies associated headache, vision changes, numbness, weakness, slurred speech, facial droop.  No palpitations.  The history is provided by the patient.       Past Medical History:  Diagnosis Date  . Arthritis   . Diabetes mellitus ([place]) 01/2018   07/2016 PREDIABETES: Fasting gluc 120.  HbA1c 6.1%.  Then A1c 6.5% 01/2018:  pt declined nutritionist referral and my recommendation to start metformin 01/20/18.  Marland Kitchen History of kidney stones   . Hypertriglyceridemia 07/2016   mild    Patient Active Problem List   Diagnosis Date Noted  . Pruritus 06/05/2018  . Rash and other nonspecific skin eruption 06/05/2018    Past Surgical History:  Procedure Laterality Date  . KIDNEY STONE SURGERY     ureteroscopy with extraction approx 2015       Family History  Problem Relation Age of Onset  . Hyperlipidemia Mother   . Hypertension Mother   . Arthritis Father   . Hyperlipidemia Father   . Hypertension Father   . Heart disease Father   . Stroke Neg Hx   . Diabetes Neg Hx   . Cancer Neg Hx     Social History    Tobacco Use  . Smoking status: Former Smoker    Packs/day: 1.00    Years: 10.00    Pack years: 10.00    Types: Cigarettes    Quit date: 06/19/1991    Years since quitting: 28.4  . Smokeless tobacco: Never Used  Substance Use Topics  . Alcohol use: Yes    Comment: occasionally  . Drug use: No    Home Medications Prior to Admission medications   Medication Sig Start Date End Date Taking? Authorizing Provider  Cholecalciferol (VITAMIN D3) 30 MCG/15ML LIQD Take by mouth.   Yes [provider]  magnesium 30 MG tablet Take 30 mg by mouth 2 (two) times daily.   Yes [provider]  Omega-3 Fatty Acids (FISH OIL) 1000 MG CAPS Take 1 capsule by mouth daily.    [provider]  pyridOXINE (B-6) 50 MG tablet Take by mouth.    [provider]  triamcinolone ointment (KENALOG) 0.1 % Apply 1 application topically 2 (two) times daily. Below the neck up to 3 weeks at a time. 08/04/18   Garnet Sierras, DO  Zinc Citrate-Phytase (ZYTAZE) 25-500 MG CAPS Take by mouth.    [provider]    Allergies    Sulfa antibiotics  Review of Systems   Review of Systems  Gastrointestinal: Positive for nausea. Negative for vomiting.  Neurological: Positive for dizziness.  All other systems reviewed and are negative.   Physical Exam Updated Vital Signs BP 138/86   Pulse (!) 57   Temp 97.8 F (36.6 C) (Oral)   Resp 17   Ht 5\' 11"  (1.803 m)   Wt 97.5 kg   SpO2 97%   BMI 29.98 kg/m   Physical Exam Vitals and nursing note reviewed.  Constitutional:      General: He is not in acute distress.    Appearance: He is well-developed. He is not ill-appearing.  HENT:     Head: Normocephalic and atraumatic.  Eyes:     Conjunctiva/sclera: Conjunctivae normal.  Cardiovascular:     Rate and Rhythm: Normal rate and regular rhythm.  Pulmonary:     Effort: Pulmonary effort is normal. No respiratory distress.     Breath sounds: Normal breath sounds.  Abdominal:      General: Bowel sounds are normal.     Palpations: Abdomen is soft.     Tenderness: There is no abdominal tenderness.  Skin:    General: Skin is warm.  Neurological:     Mental Status: He is alert.     Comments: Mental Status:  Alert, oriented, thought content appropriate, able to give a coherent history. Speech fluent without evidence of aphasia. Able to follow 2 step commands without difficulty.  Cranial Nerves:  II:  Peripheral visual fields grossly normal, pupils equal, round, reactive to light III,IV, VI: ptosis not present, extra-ocular motions intact bilaterally  V,VII: smile symmetric, facial light touch sensation equal VIII: hearing grossly normal to voice  X: uvula elevates symmetrically  XI: bilateral shoulder shrug symmetric and strong XII: midline tongue extension without fassiculations Motor:  Normal tone. 5/5 strength in upper and lower extremities bilaterally including strong and equal grip strength and dorsiflexion/plantar flexion Sensory: grossly normal in all extremities.  Cerebellar: normal finger-to-nose with bilateral upper extremities No pronator drift Gait: unsteady with standing, did not assess gait. CV: distal pulses palpable throughout    Psychiatric:        Behavior: Behavior normal.     ED Results / Procedures / Treatments   Labs (all labs ordered are listed, but only abnormal results are displayed) Labs Reviewed  BASIC METABOLIC PANEL - Abnormal; Notable for the following components:      Result Value   Glucose, Bld 152 (*)    Calcium 8.8 (*)    All other components within normal limits  CBG MONITORING, ED - Abnormal; Notable for the following components:   Glucose-Capillary 149 (*)    All other components within normal limits  CBC WITH DIFFERENTIAL/PLATELET    EKG None  Radiology No results found.  Procedures Procedures (including critical care time)  Medications Ordered in ED Medications - No data to display  ED Course  I have  reviewed the triage vital signs and the nursing notes.  Pertinent labs & imaging results that were available during my care of the patient were reviewed by me and considered in my medical decision making (see chart for details).  Clinical Course as of Nov 21 1253  Sun Nov 22, 2019  1220 Pt discussed with and evaluated by Dr. Alvino Chapel. Will send to [place] ED for MRI to r/o central cause of vertigo. .Dr Regenia Skeeter accepting transfer. Pt to transfer by private vehicle, his wife will drive him.   [JR]    Clinical Course User Index [JR] Brannon Levene, [place] N, PA-C  MDM Rules/Calculators/A&P                      Patient presenting with room spinning dizziness that began this morning around 9 AM.  Symptoms have been constant since that time though worsened with position changes.  Associated nausea.  He is off balance here was standing in the ED though no other focal neuro deficits are appreciated.  MRI is currently unavailable at this facility therefore patient be transferred to [facility], ED for MRI to rule out central cause of vertigo.  Basic labs sent.  Patient discussed and evaluated by Dr. Alvino Chapel.  Patient is agreeable with this plan, and will transfer by private vehicle, to be driven by his wife.  He is instructed report directly to [facility] ED.   ED MD, Dr. Regenia Skeeter made aware of patient's arrival and accepting transfer. Final Clinical Impression(s) / ED Diagnoses Final diagnoses:  Dizziness    Rx / DC Orders ED Discharge Orders    None       Dovey Fatzinger, [place] N, PA-C 11/22/19 1257    Davonna Belling, MD 11/22/19 1505

## 2019-12-24 ENCOUNTER — Telehealth: Payer: Self-pay

## 2020-01-14 ENCOUNTER — Other Ambulatory Visit: Payer: Self-pay

## 2020-01-14 ENCOUNTER — Ambulatory Visit (INDEPENDENT_AMBULATORY_CARE_PROVIDER_SITE_OTHER): Payer: 59 | Admitting: Family

## 2020-01-14 ENCOUNTER — Encounter: Payer: Self-pay | Admitting: Family

## 2020-01-14 VITALS — BP 140/70 | HR 71 | Temp 97.0°F | Resp 18 | Ht 71.0 in | Wt 213.1 lb

## 2020-01-14 DIAGNOSIS — J302 Other seasonal allergic rhinitis: Secondary | ICD-10-CM

## 2020-01-14 DIAGNOSIS — R21 Rash and other nonspecific skin eruption: Secondary | ICD-10-CM | POA: Diagnosis not present

## 2020-01-14 NOTE — Progress Notes (Signed)
[address] [place] [address] Dept: [phone]  FOLLOW UP NOTE  Patient ID: Joseph Cummings, male    DOB: [birth year]  Age: 64 y.o. MRN: [id number] Date of Office Visit: 01/14/2020  Assessment  Chief Complaint: Allergy Testing  HPI Joseph Cummings is a 64 year old male who presents today for follow-up of rash and other none specific skin eruption.  He was last seen on August 04, 2018 by Dr. Maudie Mercury.  He reports that polyester with formaldehyde in it causes him to develop a rash.  The rash will even occur where the formaldehyde and polyester are not even touching.  When he comes in contact with this he will have the sensation of feeling like ants are crawling on his body and a tingling/itching sensation that will be followed by a rash.  He is interested in getting reskin tested to environmental inhalants today because he also feels like this causes his rash.  Approximately 25 years ago he reports positive skin testing to certain cats, dogs, grasses, and dust.  He is interested in getting allergy injections to help with his rash.  He reports that when he moved into a duplex rental when moving to [place] that he found out that there was mold in the washing machine. He feels that washing his clothes in the washing machine with the mold in it also caused his rash to worsen also.Since getting a new washing machine he has not had this problem.  He also reports that sitting on a couch or chair with formaldehyde in it within 10 minutes will cause his rash.  Also he has problems with [place] dogs and long hair cats causing this rash.  He has not had a rash since February or March of this year since getting a letter from his primary care physician asking that his place of work get him a uniform that does not contain polyester in it.  He now wears a cotton uniform and reports that everything is fine.  He currently is taking Allegra once a day to twice a day to help prevent this rash.  Of note his  lab work for environmental inhalants on June 05, 2018 were negative.  He denies any rhinorrhea, nasal congestion, sore throats, postnasal drip, sneezing, and itchy watery eyes.  Current medications are as listed in chart.    Drug Allergies:  Allergies  Allergen Reactions   Sulfa Antibiotics Anaphylaxis    Review of Systems: Review of Systems  Constitutional: Negative for chills and fever.  HENT: Negative for congestion, nosebleeds and sore throat.   Eyes: Negative for blurred vision.  Respiratory: Negative for cough, shortness of breath and wheezing.   Cardiovascular: Negative for chest pain and palpitations.  Gastrointestinal: Negative for abdominal pain and heartburn.  Genitourinary: Negative for dysuria.  Skin: Negative for itching and rash.  Neurological: Negative for headaches.    Physical Exam: BP (!) 140/70    Pulse 71    Temp (!) 97 F (36.1 C) (Temporal)    Resp 18    Ht 5\' 11"  (1.803 m)    Wt (!) 213 lb 1.9 oz (96.7 kg)    SpO2 96%    BMI 29.72 kg/m    Physical Exam Constitutional:      Appearance: Normal appearance.  HENT:     Head: Normocephalic and atraumatic.     Right Ear: Tympanic membrane, ear canal and external ear normal.     Left Ear: Tympanic membrane, ear canal and  external ear normal.     Nose: Nose normal.     Mouth/Throat:     Mouth: Mucous membranes are moist.     Pharynx: Oropharynx is clear.  Eyes:     Conjunctiva/sclera: Conjunctivae normal.  Cardiovascular:     Rate and Rhythm: Regular rhythm.     Heart sounds: Normal heart sounds.  Pulmonary:     Effort: Pulmonary effort is normal.     Breath sounds: Normal breath sounds.     Comments: Lungs clear to auscultation Musculoskeletal:     Cervical back: Neck supple.  Skin:    General: Skin is warm.     Comments: No lesions noted  Neurological:     Mental Status: He is alert and oriented to person, place, and time.  Psychiatric:        Mood and Affect: Mood normal.         Behavior: Behavior normal.        Thought Content: Thought content normal.        Judgment: Judgment normal.     Diagnostics:  Your skin testing today was positive today for grass with a positive histamine control  Assessment and Plan: 1. Rash and other nonspecific skin eruption   2. Other seasonal allergic rhinitis     No orders of the defined types were placed in this encounter.   Patient Instructions  Rash and other nonspecific eruption Continue Allegra one tablet one to two times a day to help with itching Consider patch testing to see if you are allergic to something that you are coming into contact with. True test will look for the following sensitivities.    Please let us know if this treatment plan is not working well for you Schedule follow up appointment in 4 months   Return in about 4 months (around 05/16/2020), or if symptoms worsen or fail to improve.    Thank you for the opportunity to care for this patient.  Please do not hesitate to contact me with questions.  Althea Charon, FNP [facility]

## 2020-01-14 NOTE — Patient Instructions (Addendum)
Rash and other nonspecific eruption Continue Allegra one tablet one to two times a day to help with itching Consider patch testing to see if you are allergic to something that you are coming into contact with. True test will look for the following sensitivities.    Please let us know if this treatment plan is not working well for you Schedule follow up appointment in 4 months

## 2020-01-29 ENCOUNTER — Encounter: Payer: Self-pay | Admitting: Allergy

## 2020-02-01 ENCOUNTER — Telehealth: Payer: Self-pay | Admitting: Allergy

## 2020-02-01 NOTE — Telephone Encounter (Signed)
[facility] called for the patient needed medical records for the date of 07/29/201. Seth Bake with [facility] said that sent for medical records to a po box in [place]. Where dr Maudie Mercury is register.

## 2020-02-03 NOTE — Telephone Encounter (Signed)
Are they going to send a new request to [place]?

## 2020-02-23 NOTE — Telephone Encounter (Signed)
Ok well a request would have to been sent to [place] for records to be released.  Thank you, Candice

## 2020-02-23 NOTE — Telephone Encounter (Signed)
I dont have request from [facility] and no phone number.

## 2020-05-16 ENCOUNTER — Telehealth: Payer: Self-pay

## 2020-05-16 MED ORDER — TRIAMCINOLONE ACETONIDE 0.1 % EX OINT: 1.0000 "application " | TOPICAL_OINTMENT | Freq: Two times a day (BID) | CUTANEOUS | 1 refills | Status: AC

## 2020-05-16 NOTE — Telephone Encounter (Signed)
Patient cancelled his follow up for 05/17/20 and will call us back once he is able to get time off from his new job for patch testing. I did send in refill on the triamcinolone ointment. I also explained the patch testing protocol to him.

## 2020-05-17 ENCOUNTER — Ambulatory Visit: Payer: 59 | Admitting: Allergy

## 2020-06-29 ENCOUNTER — Telehealth: Payer: Self-pay | Admitting: Family Medicine

## 2020-06-29 NOTE — Telephone Encounter (Signed)
Patient thinks he sprained his Achilles tendon 2 days ago and wants referral for specialist. States he cannot walk on that foot. No available appointments with Dr. Anitra Lauth for a week. Can patient be worked in or need to go to UC? Please call patient to advise and schedule if appropriate.

## 2020-06-29 NOTE — Telephone Encounter (Signed)
Per [place] pt will need to go to UC if wants to be eval today.   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 UC is first choice

## 2020-06-29 NOTE — Telephone Encounter (Signed)
LVM for patient to go to UC or call back for appt next week.

## 2020-07-01 ENCOUNTER — Other Ambulatory Visit: Payer: 59

## 2020-07-05 ENCOUNTER — Encounter: Payer: Self-pay | Admitting: Family Medicine

## 2020-07-06 ENCOUNTER — Telehealth (INDEPENDENT_AMBULATORY_CARE_PROVIDER_SITE_OTHER): Payer: Medicare Other | Admitting: Medical

## 2020-07-06 ENCOUNTER — Telehealth: Payer: Self-pay

## 2020-07-06 ENCOUNTER — Other Ambulatory Visit: Payer: Self-pay

## 2020-07-06 ENCOUNTER — Other Ambulatory Visit: Payer: Self-pay | Admitting: Medical

## 2020-07-06 DIAGNOSIS — U071 COVID-19: Secondary | ICD-10-CM | POA: Diagnosis not present

## 2020-07-06 DIAGNOSIS — R059 Cough, unspecified: Secondary | ICD-10-CM

## 2020-07-06 MED ORDER — BUDESONIDE-FORMOTEROL FUMARATE 160-4.5 MCG/ACT IN AERO
2.0000 | INHALATION_SPRAY | Freq: Two times a day (BID) | RESPIRATORY_TRACT | 3 refills | Status: DC
Start: 2020-07-06 — End: 2020-07-06

## 2020-07-06 MED ORDER — ALBUTEROL SULFATE HFA 108 (90 BASE) MCG/ACT IN AERS
2.0000 | INHALATION_SPRAY | Freq: Four times a day (QID) | RESPIRATORY_TRACT | 0 refills | Status: DC | PRN
Start: 2020-07-06 — End: 2020-07-06

## 2020-07-06 MED ORDER — HYDROCODONE-HOMATROPINE 5-1.5 MG/5ML PO SYRP: 5.0000 mL | ORAL_SOLUTION | Freq: Four times a day (QID) | ORAL | 0 refills | Status: DC | PRN

## 2020-07-06 MED ORDER — AZITHROMYCIN 250 MG PO TABS: ORAL_TABLET | ORAL | 0 refills | Status: DC

## 2020-07-06 MED ORDER — AZITHROMYCIN 250 MG PO TABS
ORAL_TABLET | ORAL | 0 refills | Status: DC
Start: 2020-07-06 — End: 2020-07-06

## 2020-07-06 MED FILL — HYCODAN 5-1.5 MG/5ML SYRP: 5-1.5 | 5 days supply | Qty: 100 | Fill #0

## 2020-07-06 MED FILL — ALBUTEROL SULFATE HFA 108 (: 108 (90 BAS | 25 days supply | Qty: 18 | Fill #0

## 2020-07-06 MED FILL — AZITHROMYCIN 250 MG TABLET: 250 | 5 days supply | Qty: 6 | Fill #0

## 2020-07-06 NOTE — Addendum Note (Signed)
Addended by: Anabel Halon on: 07/06/2020 04:05 PM   Modules accepted: Orders

## 2020-07-06 NOTE — Addendum Note (Signed)
Addended by: Anabel Halon on: 07/06/2020 03:44 PM   Modules accepted: Level of Service

## 2020-07-06 NOTE — Progress Notes (Addendum)
   Subjective:    Patient ID: Joseph Cummings, male    DOB: 05-04-1956, 65 y.o.   MRN: [id number]  HPI  Virtual Visit via Video Note  I connected with Joseph Cummings on 07/06/20 at  3:00 PM EST by a video enabled telemedicine application and verified that I am speaking with the correct person using two identifiers.  Location: Patient: home Provider: office  Participants- pt and myself. Wife also present.   I discussed the limitations of evaluation and management by telemedicine and the availability of in person appointments. The patient expressed understanding and agreed to proceed.  History of Present Illness:  Pt has been sick since last week past Monday. Mild cough at onset. Past Thursday he got covid + test results.   Pt states in past if he does not treat himself early will get bronchitis. Pt states feels chest congestion. Pt has not slept due to cough. No wheezing.  Pt is fatigued, ha, and muscle aches. No altered sense of smell and taste.  Pt had 2 vaccine moderna.  Pt is on vit d and zinc/med list.      Observations/Objective: General-no acute distress, pleasant, oriented.  Coughs moderate to severe throughout entire interview.  Color of skin is normal. Lungs- on inspection lungs appear unlabored. Neck- no tracheal deviation or jvd on inspection. Neuro- gross motor function appears intact.  Assessment and Plan:  History of COVID infection and patient vaccinated with Moderna 2 vaccine series.  He never had booster.  Moderate type symptoms.  Worse symptom is severe cough and unable to sleep.  Describes historically will eventually get bronchitis when he gets sick and will need antibiotics.  Also anticipates he might begin to wheeze.  I decided to prescribe a azithromycin antibiotic and Hycodan cough syrup.  Making Symbicort inhaler available to use 2 inhalations twice daily if he begins to wheeze.  Offered getting chest x-ray today at the med center but he wants to wait  to see how he does with above first.  Explained by Monday or Tuesday if not doing well chest x-ray an option and also referral to [facility] option.  Continue vitamin D and zinc.  Explained benefit of getting O2 sat monitor and checking O2 sats level daily. 96% and above is good.  If any levels 94% or less let us know.  Follow-up in 5 to 7 days with PCP or myself.  Explained contact us sooner by MyChart or call if any questions or concerns. Follow Up Instructions:    I discussed the assessment and treatment plan with the patient. The patient was provided an opportunity to ask questions and all were answered. The patient agreed with the plan and demonstrated an understanding of the instructions.   The patient was advised to call back or seek an in-person evaluation if the symptoms worsen or if the condition fails to improve as anticipated.  Time spent with patient today was  22 minutes which consisted of chart revdiew, discussing diagnosis, work up treatment and documentation.   Mackie Pai, PA-C   Review of Systems     Objective:   Physical Exam        Assessment & Plan:

## 2020-07-06 NOTE — Patient Instructions (Signed)
History of COVID infection and patient vaccinated with Moderna 2 vaccine series.  He never had booster.  Moderate type symptoms.  Worse symptom is severe cough and unable to sleep.  Describes historically will eventually get bronchitis when he gets sick and will need antibiotics.  Also anticipates he might begin to wheeze.  I decided to prescribe a azithromycin antibiotic and Hycodan cough syrup.  Making Symbicort inhaler available to use 2 inhalations twice daily if he begins to wheeze.  Offered getting chest x-ray today at the med center but he wants to wait to see how he does with above first.  Explained by Monday or Tuesday if not doing well chest x-ray an option and also referral to [facility] option.  Continue vitamin D and zinc.  Explained benefit of getting O2 sat monitor and checking O2 sats level daily. 96% and above is good.  If any levels 94% or less let us know.  Follow-up in 5 to 7 days with PCP or myself.  Explained contact us sooner by MyChart or call if any questions or concerns.

## 2020-07-06 NOTE — Telephone Encounter (Signed)
Pt sent mychart message, response given advising him to schedule appt for medication treatment. VV appt scheduled with [facility] provider  [facility] - Client TELEPHONE ADVICE RECORD [facility] Patient Name: Joseph Cummings Gender: Unknown DOB: Mar 25, 1956 Age: 65 Y 7 M 18 D Return Phone Number: [phone] (Primary) Address: City/State/Zip: [place] [address] Client [facility] - Client Client Site [facility] - Day Physician Crissie Sickles - MD Contact Type Call Who Is Calling Patient / Member / Family / Caregiver Call Type Triage / Clinical Relationship To Patient Self Return Phone Number [phone] (Primary) Chief Complaint BREATHING - shortness of breath or sounds breathless Reason for Call Symptomatic / Request for Health Information Initial Comment Caller tested Covid + last week. Messaged through pt portal. Cough and congestion has gone down to chest. Cough hurts chest and short of breath. Translation No Nurse Assessment Nurse: Wynetta Emery, RN, Baker Janus Date/Time Eilene Ghazi Time): 07/06/2020 8:58:32 AM Confirm and document reason for call. If symptomatic, describe symptoms. ---Estill tested positive for Covid last week- and is having a cough, congestion and shortness of breath -- states it has settled in his chest and feels he is getting bronchitis is requesting a Z pak and prednisone to get him over this or something else that can rid him of this. Does the patient have any new or worsening symptoms? ---Yes Will a triage be completed? ---Yes Related visit to physician within the last 2 weeks? ---No Does the PT have any chronic conditions? (i.e. diabetes, asthma, this includes High risk factors for pregnancy, etc.) ---No Is this a behavioral health or substance abuse call? ---No Guidelines Guideline Title Affirmed Question Affirmed Notes Nurse Date/Time ([place] Time) COVID-19 - Diagnosed or Suspected MILD difficulty  breathing (e.g., minimal/no SOB at rest, SOB with walking, pulse <100) Wynetta Emery, RN, Baker Janus 07/06/2020 9:00:58 AM Disp. Time Eilene Ghazi Time) Disposition Final User 07/06/2020 8:55:41 AM Send to Urgent Vassie Loll 07/06/2020 9:03:04 AM See HCP within 4 Hours (or PCP triage) Yes Wynetta Emery, RN, Baker Janus PLEASE NOTE: All timestamps contained within this report are represented as [place] Standard Time. CONFIDENTIALTY NOTICE: This fax transmission is intended only for the addressee. It contains information that is legally privileged, confidential or otherwise protected from use or disclosure. If you are not the intended recipient, you are strictly prohibited from reviewing, disclosing, copying using or disseminating any of this information or taking any action in reliance on or regarding this information. If you have received this fax in error, please notify us immediately by telephone so that we can arrange for its return to [place]. Phone: [phone], Toll-Free: [phone], Fax: [phone] Page: 2 of 2 Call Id: [id number] [place] Disagree/Comply Comply Caller Understands Yes PreDisposition Call Doctor Care Advice Given Per Guideline SEE HCP (OR PCP TRIAGE) WITHIN 4 HOURS: * The treatment is the same whether you have COVID-19, influenza or some other respiratory virus. * Cough: Use cough drops. * Feeling dehydrated: Drink extra liquids. If the air in your home is dry, use a humidifier. * Fever: For fever over 101 F (38.3 C), take acetaminophen every 4 to 6 hours (Adults 650 mg) OR ibuprofen every 6 to 8 hours (Adults 400 mg). Before taking any medicine, read all the instructions on the package. Do not take aspirin unless your doctor has prescribed it for you. * Muscle aches, headache, and other pains: Often this comes and goes with the fever. Take acetaminophen every 4 to 6 hours (Adults 650 mg)  OR ibuprofen every 6 to 8 hours (Adults 400 mg). Before taking any medicine, read all the instructions on  the package. * You become worse CARE ADVICE given per COVID-19 - DIAGNOSED OR SUSPECTED (Adult) guideline. Comments User: Michele Rockers, RN Date/Time Eilene Ghazi Time): 07/06/2020 9:12:55 AM RN NOTE; Patient calling in regarding his s/sx of Covid. would like information regarding the antibody infusion for covid -- may want to take it -- not getting better -- triaged to see md in 4 hours; he is saying he wants z pak and prednisone called in feels horrible. User: Michele Rockers, RN Date/Time Eilene Ghazi Time): 07/06/2020 9:13:35 AM RN NOTE; PLEASE CALL PATIENT BACK TO DETERMINE WHAT HE NEEDS TO DO. Referrals REFERRED TO PCP OFFICE

## 2020-11-11 ENCOUNTER — Encounter: Payer: Self-pay | Admitting: Registered Nurse

## 2020-11-11 ENCOUNTER — Ambulatory Visit (INDEPENDENT_AMBULATORY_CARE_PROVIDER_SITE_OTHER): Payer: Medicare Other | Admitting: Registered Nurse

## 2020-11-11 ENCOUNTER — Other Ambulatory Visit: Payer: Self-pay

## 2020-11-11 VITALS — BP 134/70 | HR 60 | Temp 98.0°F | Resp 18 | Ht 71.0 in | Wt 216.0 lb

## 2020-11-11 DIAGNOSIS — Z125 Encounter for screening for malignant neoplasm of prostate: Secondary | ICD-10-CM | POA: Diagnosis not present

## 2020-11-11 DIAGNOSIS — Z13220 Encounter for screening for lipoid disorders: Secondary | ICD-10-CM | POA: Diagnosis not present

## 2020-11-11 DIAGNOSIS — Z13228 Encounter for screening for other metabolic disorders: Secondary | ICD-10-CM | POA: Diagnosis not present

## 2020-11-11 DIAGNOSIS — R7303 Prediabetes: Secondary | ICD-10-CM

## 2020-11-11 DIAGNOSIS — B070 Plantar wart: Secondary | ICD-10-CM

## 2020-11-11 DIAGNOSIS — Z1329 Encounter for screening for other suspected endocrine disorder: Secondary | ICD-10-CM | POA: Diagnosis not present

## 2020-11-11 DIAGNOSIS — Z130 Encounter for screening for diseases of the blood and blood-forming organs and certain disorders involving the immune mechanism: Secondary | ICD-10-CM | POA: Diagnosis not present

## 2020-11-11 DIAGNOSIS — Z1211 Encounter for screening for malignant neoplasm of colon: Secondary | ICD-10-CM

## 2020-11-11 LAB — COMPREHENSIVE METABOLIC PANEL
ALT: 26 U/L (ref 0–53)
AST: 21 U/L (ref 0–37)
Albumin: 4.1 g/dL (ref 3.5–5.2)
Alkaline Phosphatase: 74 U/L (ref 39–117)
BUN: 19 mg/dL (ref 6–23)
CO2: 25 mEq/L (ref 19–32)
Calcium: 9.2 mg/dL (ref 8.4–10.5)
Chloride: 105 mEq/L (ref 96–112)
Creatinine, Ser: 0.89 mg/dL (ref 0.40–1.50)
GFR: 89.79 mL/min (ref >60.00)
Glucose, Bld: 136 mg/dL — ABNORMAL HIGH (ref 70–99)
Potassium: 4.2 mEq/L (ref 3.5–5.1)
Sodium: 139 mEq/L (ref 135–145)
Total Bilirubin: 0.8 mg/dL (ref 0.2–1.2)
Total Protein: 6.6 g/dL (ref 6.0–8.3)

## 2020-11-11 LAB — CBC WITH DIFFERENTIAL/PLATELET
Basophils Absolute: 0.1 10*3/uL (ref 0.0–0.1)
Basophils Relative: 1 % (ref 0.0–3.0)
Eosinophils Absolute: 0.1 10*3/uL (ref 0.0–0.7)
Eosinophils Relative: 1.8 % (ref 0.0–5.0)
HCT: 45 % (ref 39.0–52.0)
Hemoglobin: 15.6 g/dL (ref 13.0–17.0)
Lymphocytes Relative: 26.3 % (ref 12.0–46.0)
Lymphs Abs: 1.5 10*3/uL (ref 0.7–4.0)
MCHC: 34.7 g/dL (ref 30.0–36.0)
MCV: 90.4 fl (ref 78.0–100.0)
Monocytes Absolute: 0.6 10*3/uL (ref 0.1–1.0)
Monocytes Relative: 10.6 % (ref 3.0–12.0)
Neutro Abs: 3.4 10*3/uL (ref 1.4–7.7)
Neutrophils Relative %: 60.3 % (ref 43.0–77.0)
Platelets: 164 10*3/uL (ref 150.0–400.0)
RBC: 4.98 Mil/uL (ref 4.22–5.81)
RDW: 13.5 % (ref 11.5–15.5)
WBC: 5.6 10*3/uL (ref 4.0–10.5)

## 2020-11-11 LAB — TSH: TSH: 1.09 u[IU]/mL (ref 0.35–4.50)

## 2020-11-11 LAB — PSA, MEDICARE: PSA: 0.37 ng/ml (ref 0.10–4.00)

## 2020-11-11 LAB — LIPID PANEL
Cholesterol: 183 mg/dL (ref 0–200)
HDL: 49.5 mg/dL (ref >39.00)
LDL Cholesterol: 95 mg/dL (ref 0–99)
NonHDL: 133.13
Total CHOL/HDL Ratio: 4
Triglycerides: 190 mg/dL — ABNORMAL HIGH (ref 0.0–149.0)
VLDL: 38 mg/dL (ref 0.0–40.0)

## 2020-11-11 LAB — HEMOGLOBIN A1C: Hgb A1c MFr Bld: 6.9 % — ABNORMAL HIGH (ref 4.6–6.5)

## 2020-11-11 MED ORDER — FLUOROURACIL 2 % EX SOLN: 1.0000 "application " | Freq: Every day | CUTANEOUS | 0 refills | Status: DC

## 2020-11-11 NOTE — Progress Notes (Signed)
Established Patient Office Visit  Subjective:  Patient ID: Joseph Cummings, male    DOB: 02/16/1956  Age: 65 y.o. MRN: [id number]  CC:  Chief Complaint  Patient presents with  . Transitions Of Care    Patient states he is here for [facility] and also a CPE.    HPI Joseph Cummings presents for visit to est care and CPE  Histories reviewed and updated with patient.   Wart: L index finger. Stubborn. Not responding to OTC treatment Would be interested in topical treatment, cryotherapy after if needed.  Otherwise, no acute concerns.  Covid vaccine: 09/12/19 and 10/10/19  Past Medical History:  Diagnosis Date  . Arthritis   . Diabetes mellitus ([place]) 01/2018   07/2016 PREDIABETES: Fasting gluc 120.  HbA1c 6.1%.  Then A1c 6.5% 01/2018:  pt declined nutritionist referral and my recommendation to start metformin 01/20/18.  Marland Kitchen History of kidney stones   . Hypertriglyceridemia 07/2016   mild    Past Surgical History:  Procedure Laterality Date  . KIDNEY STONE SURGERY     ureteroscopy with extraction approx 2015    Family History  Problem Relation Age of Onset  . Hyperlipidemia Mother   . Hypertension Mother   . Arthritis Father   . Hyperlipidemia Father   . Hypertension Father   . Heart disease Father   . Stroke Neg Hx   . Diabetes Neg Hx   . Cancer Neg Hx     Social History   Socioeconomic History  . Marital status: Married    Spouse name: Not on file  . Number of children: Not on file  . Years of education: Not on file  . Highest education level: Not on file  Occupational History  . Not on file  Tobacco Use  . Smoking status: Former Smoker    Packs/day: 1.00    Years: 10.00    Pack years: 10.00    Types: Cigarettes    Quit date: 06/19/1991    Years since quitting: 29.4  . Smokeless tobacco: Never Used  Vaping Use  . Vaping Use: Never used  Substance and Sexual Activity  . Alcohol use: Yes    Comment: occasionally  . Drug use: No  . Sexual activity: Not on file   Other Topics Concern  . Not on file  Social History Narrative   Married, 3 children.d   Educ: HS   Occup: retired Ecologist.     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   No tobacco (quit 25 yrs ago).  Alcohol: rare/occ.   Social Determinants of Health   Financial Resource Strain: Not on file  Food Insecurity: Not on file  Transportation Needs: Not on file  Physical Activity: Not on file  Stress: Not on file  Social Connections: Not on file  Intimate Partner Violence: Not on file    Outpatient Medications Prior to Visit  Medication Sig Dispense Refill  . cholecalciferol (VITAMIN D3) 25 MCG (1000 UNIT) tablet Take 1,000 Units by mouth daily.    . fexofenadine (ALLEGRA) 180 MG tablet Take 180 mg by mouth daily.    . magnesium 30 MG tablet Take 30 mg by mouth daily.     . Omega-3 Fatty Acids (FISH OIL) 1000 MG CAPS Take 1 capsule by mouth daily.    Marland Kitchen pyridOXINE (B-6) 50 MG tablet Take 50 mg by mouth daily.     Marland Kitchen triamcinolone ointment (KENALOG) 0.1 % Apply 1 application topically 2 (two) times daily. Below the neck  up to 3 weeks at a time. 453.6 g 1  . Zinc Citrate-Phytase 25-500 MG CAPS Take 1 capsule by mouth daily.     Marland Kitchen albuterol (VENTOLIN HFA) 108 (90 Base) MCG/ACT inhaler INHALE 2 PUFFS BY MOUTH INTO THE LUNGS EVERY 6 (SIX) HOURS AS NEEDED. (Patient not taking: Reported on 11/11/2020) 18 g 0  . azithromycin (ZITHROMAX) 250 MG tablet TAKE 2 TABLETS BY MOUTH ON DAY 1 THEN TAKE 1 TABLET DAILY FOR THE NEXT 4 DAYS (Patient not taking: Reported on 11/11/2020) 6 tablet 0  . HYDROcodone-homatropine (HYCODAN) 5-1.5 MG/5ML syrup TAKE 5 MLS BY MOUTH EVERY 6 (SIX) HOURS AS NEEDED FOR COUGH. (Patient not taking: Reported on 11/11/2020) 100 mL 0  . LORazepam (ATIVAN) 0.5 MG tablet Take 1 tablet (0.5 mg total) by mouth every 8 (eight) hours as needed (dizziness). (Patient not taking: Reported on 11/11/2020) 20 tablet 0   No facility-administered medications prior to visit.    Allergies  Allergen  Reactions  . Sulfa Antibiotics Anaphylaxis    ROS Review of Systems  Constitutional: Negative.   HENT: Negative.   Eyes: Negative.   Respiratory: Negative.   Cardiovascular: Negative.   Gastrointestinal: Negative.   Genitourinary: Negative.   Musculoskeletal: Negative.   Skin: Negative.   Neurological: Negative.   Psychiatric/Behavioral: Negative.   All other systems reviewed and are negative.     Objective:    Physical Exam Vitals and nursing note reviewed.  Constitutional:      General: He is not in acute distress.    Appearance: Normal appearance. He is normal weight. He is not ill-appearing, toxic-appearing or diaphoretic.  HENT:     Head: Normocephalic and atraumatic.     Right Ear: Tympanic membrane, ear canal and external ear normal. There is no impacted cerumen.     Left Ear: Tympanic membrane, ear canal and external ear normal. There is no impacted cerumen.     Nose: Nose normal. No congestion or rhinorrhea.     Mouth/Throat:     Mouth: Mucous membranes are moist.     Pharynx: Oropharynx is clear. No oropharyngeal exudate or posterior oropharyngeal erythema.  Eyes:     General: No scleral icterus.       Right eye: No discharge.        Left eye: No discharge.     Extraocular Movements: Extraocular movements intact.     Conjunctiva/sclera: Conjunctivae normal.     Pupils: Pupils are equal, round, and reactive to light.  Neck:     Vascular: No carotid bruit.  Cardiovascular:     Rate and Rhythm: Normal rate and regular rhythm.     Pulses: Normal pulses.     Heart sounds: Normal heart sounds. No murmur heard. No friction rub. No gallop.   Pulmonary:     Effort: Pulmonary effort is normal. No respiratory distress.     Breath sounds: Normal breath sounds. No stridor. No wheezing, rhonchi or rales.  Chest:     Chest wall: No tenderness.  Abdominal:     General: Abdomen is flat. Bowel sounds are normal. There is no distension.     Palpations: Abdomen is soft.  There is no mass.     Tenderness: There is no abdominal tenderness. There is no right CVA tenderness, left CVA tenderness, guarding or rebound.     Hernia: No hernia is present.  Musculoskeletal:        General: No swelling, tenderness, deformity or signs of injury. Normal range of motion.  Cervical back: Normal range of motion and neck supple. No rigidity or tenderness.     Right lower leg: No edema.     Left lower leg: No edema.  Lymphadenopathy:     Cervical: No cervical adenopathy.  Skin:    General: Skin is warm and dry.     Capillary Refill: Capillary refill takes less than 2 seconds.     Coloration: Skin is not jaundiced or pale.     Findings: Lesion (plantar wart on L index finger) present. No bruising, erythema or rash.  Neurological:     General: No focal deficit present.     Mental Status: He is alert and oriented to person, place, and time. Mental status is at baseline.     Cranial Nerves: No cranial nerve deficit.     Motor: No weakness.     Gait: Gait normal.  Psychiatric:        Mood and Affect: Mood normal.        Behavior: Behavior normal.        Thought Content: Thought content normal.        Judgment: Judgment normal.     BP 134/70   Pulse 60   Temp 98 F (36.7 C) (Temporal)   Resp 18   Ht 5\' 11"  (1.803 m)   Wt 216 lb (98 kg)   SpO2 99%   BMI 30.13 kg/m  Wt Readings from Last 3 Encounters:  11/11/20 216 lb (98 kg)  01/14/20 (!) 213 lb 1.9 oz (96.7 kg)  11/22/19 214 lb 15.2 oz (97.5 kg)     There are no preventive care reminders to display for this patient.  There are no preventive care reminders to display for this patient.  Lab Results  Component Value Date   TSH 1.21 01/17/2018   Lab Results  Component Value Date   WBC 5.1 11/22/2019   HGB 15.5 11/22/2019   HCT 44.9 11/22/2019   MCV 90.2 11/22/2019   PLT 162 11/22/2019   Lab Results  Component Value Date   NA 138 11/22/2019   K 4.3 11/22/2019   CO2 23 11/22/2019   GLUCOSE 152  (H) 11/22/2019   BUN 18 11/22/2019   CREATININE 1.05 11/22/2019   BILITOT 1.1 01/17/2018   ALKPHOS 70 01/17/2018   AST 17 01/17/2018   ALT 25 01/17/2018   PROT 7.3 01/17/2018   ALBUMIN 4.6 01/17/2018   CALCIUM 8.8 (L) 11/22/2019   ANIONGAP 9 11/22/2019   GFR 85.13 01/17/2018   Lab Results  Component Value Date   CHOL 161 01/17/2018   Lab Results  Component Value Date   HDL 52.40 01/17/2018   No results found for: [facility] Lab Results  Component Value Date   TRIG 206.0 (H) 01/17/2018   Lab Results  Component Value Date   CHOLHDL 3 01/17/2018   Lab Results  Component Value Date   HGBA1C 6.5 01/17/2018      Assessment & Plan:   Problem List Items Addressed This Visit   None   Visit Diagnoses    Screening for endocrine, metabolic and immunity disorder    -  Primary   Lipid screening          No orders of the defined types were placed in this encounter.   Follow-up: No follow-ups on file.   PLAN  Labs collected. Will follow up with the patient as warranted.  Fluorouracil for wart. Discussed r/b/se. Will return in around 3 weeks if ineffective.  Exam  unremarkable  Patient encouraged to call clinic with any questions, comments, or concerns.  Maximiano Coss, NP

## 2020-11-11 NOTE — Patient Instructions (Addendum)
Joseph Cummings -   Doristine Devoid to meet you.  Let's try the fluorouracil for your wart. If there's no resolution in 3 weeks, please return for cryotherapy with liquid nitrogen.  Labs today: CBC, CMP, A1c, Lipid Panel, TSH, and PSA. Should be back within a day or two. I'll leave notes on MyChart with these results, I'll call if there's any urgent concerns.  Cologuard materials will be sent to you. I generally recommend completing this within 30 days, otherwise, it tends to fade into the background and patients forget.  Let's plan to check in at least once each year, sooner if labs indicate. Certainly stop by sooner with any new concerns.   Thank you  Rich     If you have lab work done today you will be contacted with your lab results within the next 2 weeks.  If you have not heard from [place] then please contact us. The fastest way to get your results is to register for My Chart.   IF you received an x-ray today, you will receive an invoice from [facility]. Please contact [facility] at [phone] with questions or concerns regarding your invoice.   IF you received labwork today, you will receive an invoice from [facility]. Please contact [facility] at [phone] with questions or concerns regarding your invoice.   Our billing staff will not be able to assist you with questions regarding bills from these companies.  You will be contacted with the lab results as soon as they are available. The fastest way to get your results is to activate your My Chart account. Instructions are located on the last page of this paperwork. If you have not heard from [place] regarding the results in 2 weeks, please contact this office.

## 2020-12-02 IMAGING — MR MR HEAD W/O CM
10 of 11 series · 43 of 48 positions shown · non-contrast
Comparison: No pertinent prior studies available for comparison.

CLINICAL DATA: Ataxia, stroke suspected. Additional history
obtained from electronic medical record: Dizziness

EXAM:
MRI HEAD WITHOUT CONTRAST
TECHNIQUE: Multiplanar, multiecho pulse sequences of the brain and surrounding
structures were obtained without intravenous contrast.

[Series 5: DWI · axial · 3.0mm · 0.92mm/px · z∈[-49,+103]mm · 10 of 104 slices shown (1 of 4)]
[im 1/104]
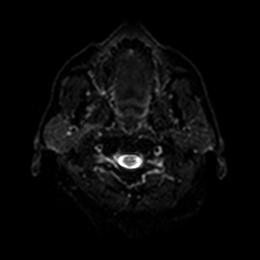
[im 12/104]
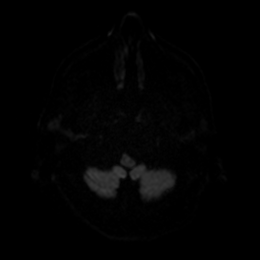
[im 23/104]
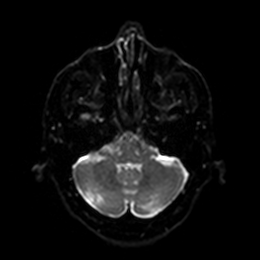
[im 35/104]
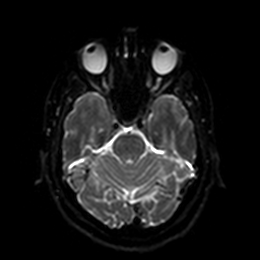
[im 46/104]
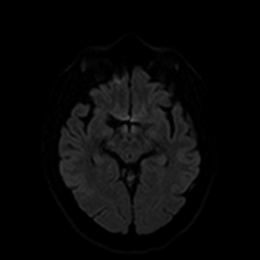
[im 58/104]
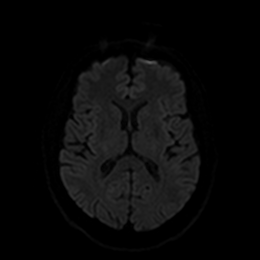
[im 69/104]
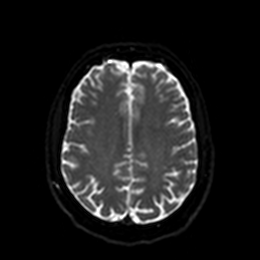
[im 81/104]
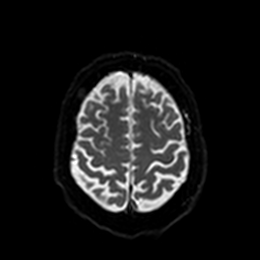
[im 92/104]
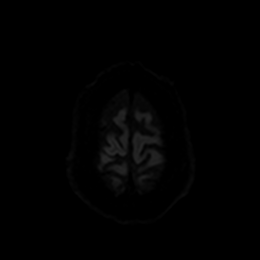
[im 104/104]
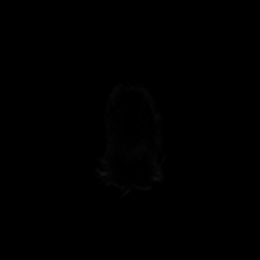

[Series 6: DWI · axial · 3.0mm · 0.92mm/px · z∈[-49,+103]mm · 5 of 52 slices shown (2 of 4)]
[im 1/52]
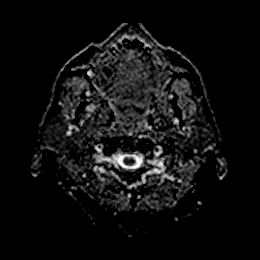
[im 13/52]
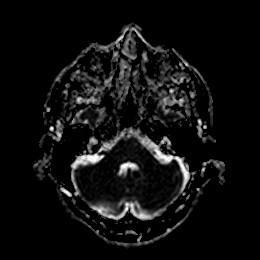
[im 26/52]
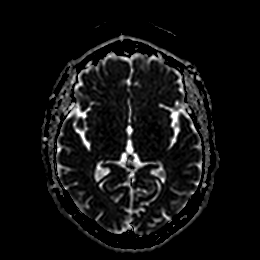
[im 39/52]
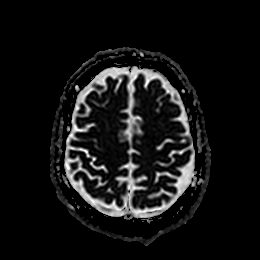
[im 52/52]
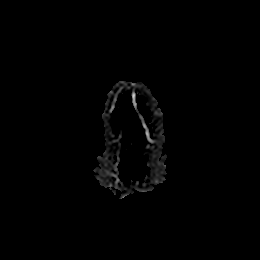

[Series 7: DWI · coronal · 4.0mm · 0.88mm/px · 6 of 72 slices shown (3 of 4)]
[im 1/72]
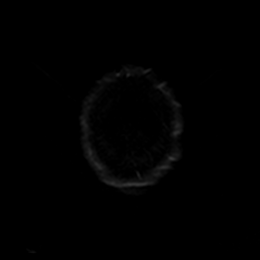
[im 15/72]
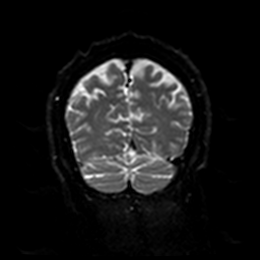
[im 29/72]
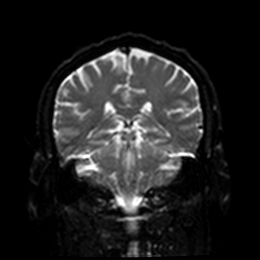
[im 43/72]
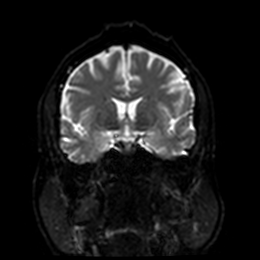
[im 57/72]
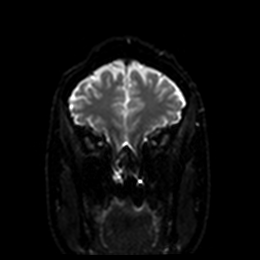
[im 72/72]
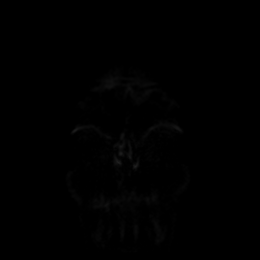

[Series 8: DWI · coronal · 4.0mm · 0.88mm/px · 3 of 36 slices shown (4 of 4)]
[im 1/36]
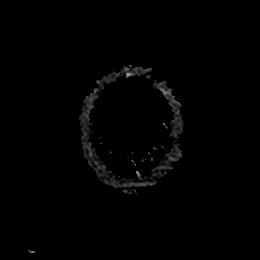
[im 18/36]
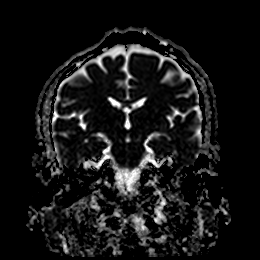
[im 36/36]
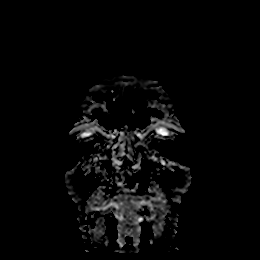

[Series 9: FLAIR · axial · 5.0mm · 0.45mm/px · z∈[-45,+98]mm · 2 of 25 slices shown]
[im 1/25]
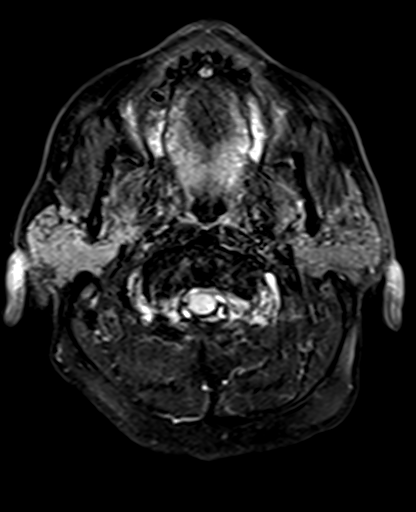
[im 25/25]
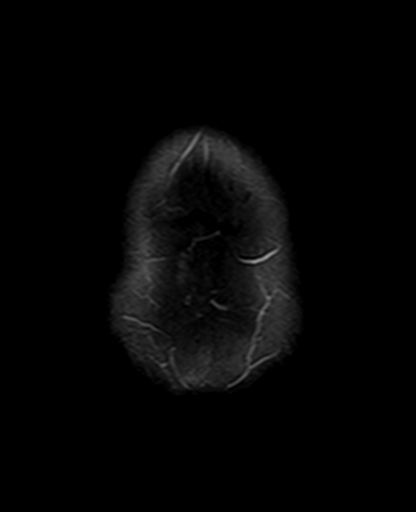

[Series 11: pha_images · axial · 3.0mm · 0.90mm/px · z∈[-55,+108]mm · 5 of 56 slices shown]
[im 1/56]
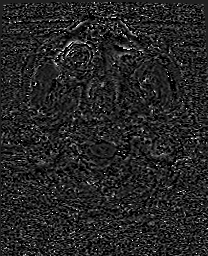
[im 14/56]
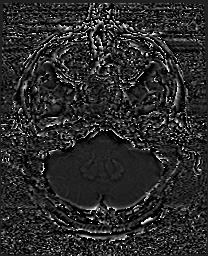
[im 28/56]
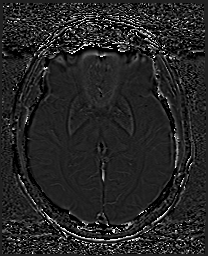
[im 42/56]
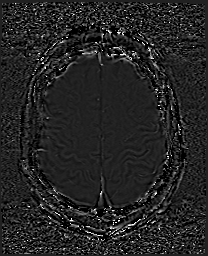
[im 56/56]
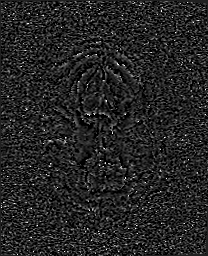

[Series 12: swi_images · axial · 3.0mm · 0.90mm/px · z∈[-55,+108]mm · 5 of 56 slices shown]
[im 1/56]
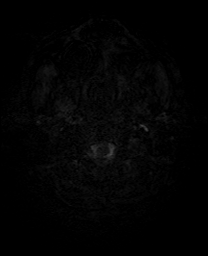
[im 14/56]
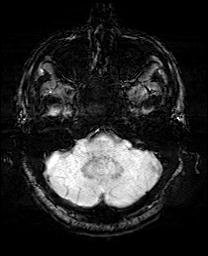
[im 28/56]
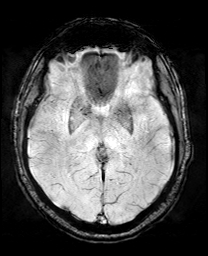
[im 42/56]
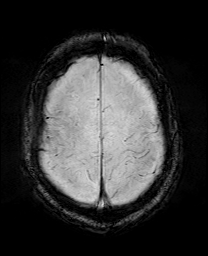
[im 56/56]
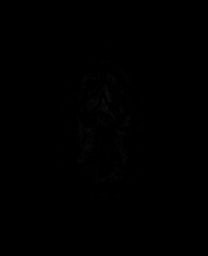

[Series 14: T1 · sagittal · 5.0mm · 0.78mm/px · 2 of 25 slices shown]
[im 1/25]
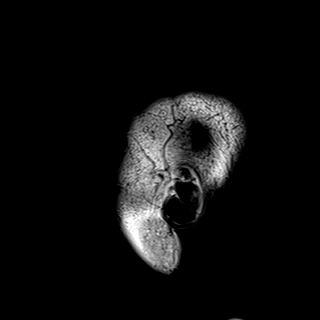
[im 25/25]
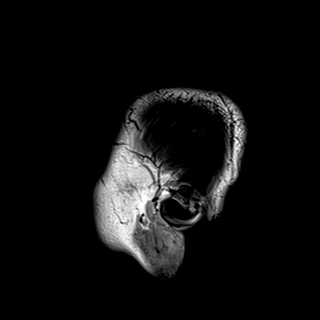

[Series 15: T2 · axial · 5.0mm · 0.72mm/px · z∈[-44,+98]mm · 2 of 25 slices shown (1 of 2)]
[im 1/25]
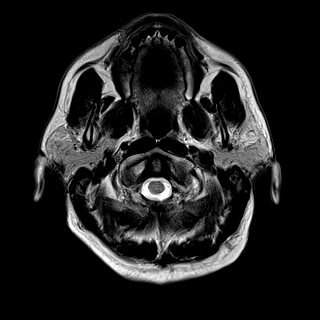
[im 25/25]
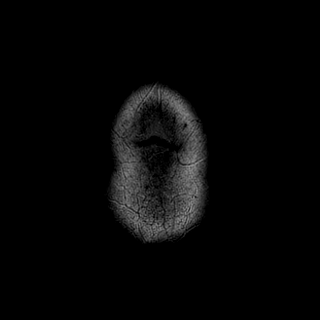

[Series 17: T2 · coronal · 5.0mm · 0.34mm/px · 3 of 30 slices shown (2 of 2)]
[im 1/30]
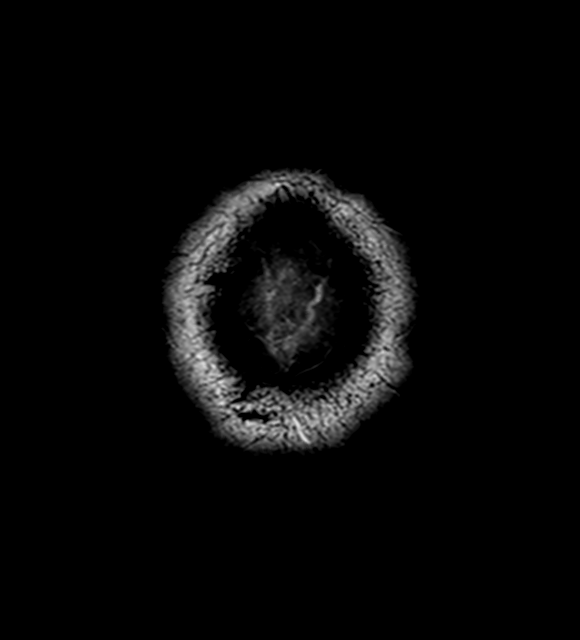
[im 15/30]
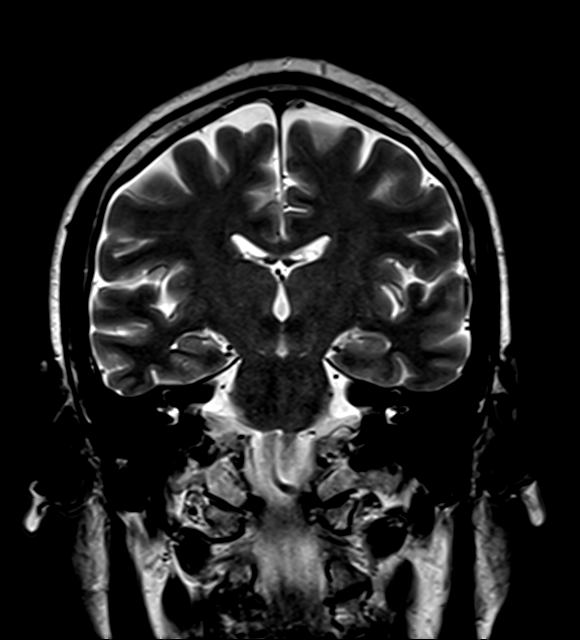
[im 30/30]
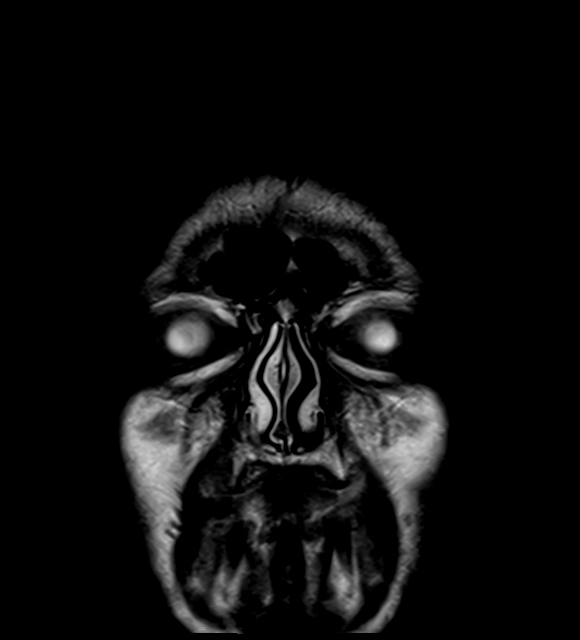

[43 of 48 positions shown; findings below may reference images not displayed]

FINDINGS: Brain:

Cerebral volume is normal for age.

There is no significant white matter disease for age.

There is no acute infarct.

No evidence of intracranial mass.

No chronic intracranial blood products.

No extra-axial fluid collection.

No midline shift.

Vascular: Expected proximal arterial flow voids.

Skull and upper cervical spine: No focal marrow lesion.

Sinuses/Orbits: Visualized orbits show no acute finding. Trace
ethmoid sinus mucosal thickening. Tiny left maxillary sinus mucous
retention cyst. No significant mastoid effusion.
IMPRESSION: Unremarkable MRI appearance of the brain for age.

No evidence of acute intracranial abnormality, including acute
infarction.

Minimal ethmoid sinus mucosal thickening. Tiny left maxillary sinus
mucous retention cyst.

## 2021-03-03 ENCOUNTER — Telehealth: Payer: Self-pay | Admitting: Registered Nurse

## 2021-03-03 NOTE — Telephone Encounter (Signed)
Spoke with patient he is unable to AWV on Wednesdays or Thursday.  He will try to do it with pcp

## 2021-10-12 DIAGNOSIS — S62309A Unspecified fracture of unspecified metacarpal bone, initial encounter for closed fracture: Secondary | ICD-10-CM

## 2021-10-12 DIAGNOSIS — E119 Type 2 diabetes mellitus without complications: Secondary | ICD-10-CM | POA: Insufficient documentation

## 2021-10-12 DIAGNOSIS — E1165 Type 2 diabetes mellitus with hyperglycemia: Secondary | ICD-10-CM | POA: Insufficient documentation

## 2021-10-12 DIAGNOSIS — E781 Pure hyperglyceridemia: Secondary | ICD-10-CM | POA: Insufficient documentation

## 2021-10-12 HISTORY — DX: Unspecified fracture of unspecified metacarpal bone, initial encounter for closed fracture: S62.309A

## 2021-11-01 LAB — COLOGUARD: Cologuard: NEGATIVE

## 2022-10-31 LAB — MICROALBUMIN / CREATININE URINE RATIO
Creatinine, POC: 64 mg/dL
Microalb Creat Ratio: 11
Microalbumin, Urine: 0.7

## 2022-10-31 LAB — HEMOGLOBIN A1C: A1c: 7.2

## 2022-10-31 LAB — COMPREHENSIVE METABOLIC PANEL (CC13): EGFR: 87

## 2023-02-20 LAB — COMPREHENSIVE METABOLIC PANEL (CC13): EGFR: 88

## 2023-02-20 LAB — HEMOGLOBIN A1C: A1c: 7.1

## 2023-10-18 ENCOUNTER — Other Ambulatory Visit: Payer: Self-pay

## 2023-10-18 ENCOUNTER — Encounter: Payer: Self-pay | Admitting: Internal Medicine

## 2023-10-18 ENCOUNTER — Ambulatory Visit (INDEPENDENT_AMBULATORY_CARE_PROVIDER_SITE_OTHER): Admitting: Internal Medicine

## 2023-10-18 ENCOUNTER — Other Ambulatory Visit (HOSPITAL_BASED_OUTPATIENT_CLINIC_OR_DEPARTMENT_OTHER): Payer: Self-pay

## 2023-10-18 VITALS — BP 122/76 | HR 69 | Temp 97.7°F | Ht 71.0 in | Wt 214.2 lb

## 2023-10-18 DIAGNOSIS — E785 Hyperlipidemia, unspecified: Secondary | ICD-10-CM

## 2023-10-18 DIAGNOSIS — E119 Type 2 diabetes mellitus without complications: Secondary | ICD-10-CM | POA: Diagnosis not present

## 2023-10-18 DIAGNOSIS — Z87442 Personal history of urinary calculi: Secondary | ICD-10-CM

## 2023-10-18 DIAGNOSIS — E569 Vitamin deficiency, unspecified: Secondary | ICD-10-CM | POA: Diagnosis not present

## 2023-10-18 DIAGNOSIS — E781 Pure hyperglyceridemia: Secondary | ICD-10-CM

## 2023-10-18 DIAGNOSIS — Z7984 Long term (current) use of oral hypoglycemic drugs: Secondary | ICD-10-CM

## 2023-10-18 LAB — COMPREHENSIVE METABOLIC PANEL WITH GFR
ALT: 23 U/L (ref 0–53)
AST: 18 U/L (ref 0–37)
Albumin: 4.4 g/dL (ref 3.5–5.2)
Alkaline Phosphatase: 72 U/L (ref 39–117)
BUN: 14 mg/dL (ref 6–23)
CO2: 24 meq/L (ref 19–32)
Calcium: 9.5 mg/dL (ref 8.4–10.5)
Chloride: 102 meq/L (ref 96–112)
Creatinine, Ser: 0.88 mg/dL (ref 0.40–1.50)
GFR: 88.26 mL/min (ref >60.00)
Glucose, Bld: 127 mg/dL — ABNORMAL HIGH (ref 70–99)
Potassium: 4.4 meq/L (ref 3.5–5.1)
Sodium: 139 meq/L (ref 135–145)
Total Bilirubin: 0.7 mg/dL (ref 0.2–1.2)
Total Protein: 7.1 g/dL (ref 6.0–8.3)

## 2023-10-18 LAB — LIPID PANEL
Cholesterol: 184 mg/dL (ref 0–200)
HDL: 45.6 mg/dL (ref >39.00)
LDL Cholesterol: 72 mg/dL (ref 0–99)
NonHDL: 138.82
Total CHOL/HDL Ratio: 4
Triglycerides: 334 mg/dL — ABNORMAL HIGH (ref 0.0–149.0)
VLDL: 66.8 mg/dL — ABNORMAL HIGH (ref 0.0–40.0)

## 2023-10-18 LAB — MICROALBUMIN / CREATININE URINE RATIO
Creatinine,U: 60.2 mg/dL
Microalb Creat Ratio: UNDETERMINED mg/g (ref 0.0–30.0)
Microalb, Ur: <0.7 mg/dL

## 2023-10-18 LAB — HEMOGLOBIN A1C: Hgb A1c MFr Bld: 8 % — ABNORMAL HIGH (ref 4.6–6.5)

## 2023-10-18 MED ORDER — PYRIDOXINE HCL 50 MG PO TABS
50.0000 mg | ORAL_TABLET | Freq: Every day | ORAL | 3 refills | Status: DC
  Filled 2023-10-18: qty 90, 90d supply, fill #0

## 2023-10-18 MED ORDER — ZINC CITRATE-PHYTASE 25-500 MG PO CAPS
1.0000 | ORAL_CAPSULE | Freq: Every day | ORAL | 3 refills | Status: AC
Start: 2023-10-18 — End: ?
  Filled 2023-10-18: qty 90, fill #0

## 2023-10-18 MED ORDER — DAPAGLIFLOZIN PROPANEDIOL 5 MG PO TABS
5.0000 mg | ORAL_TABLET | Freq: Every day | ORAL | 3 refills | Status: DC
  Filled 2023-10-18: qty 30, 30d supply, fill #0

## 2023-10-18 MED ORDER — FISH OIL 1000 MG PO CAPS
1.0000 | ORAL_CAPSULE | Freq: Every day | ORAL | 3 refills | Status: DC
  Filled 2023-10-18: qty 160, 160d supply, fill #0

## 2023-10-18 MED ORDER — ROSUVASTATIN CALCIUM 10 MG PO TABS
10.0000 mg | ORAL_TABLET | Freq: Every day | ORAL | 3 refills | Status: DC
  Filled 2023-10-18: qty 90, 90d supply, fill #0

## 2023-10-18 MED ORDER — METFORMIN HCL 500 MG PO TABS
500.0000 mg | ORAL_TABLET | Freq: Every day | ORAL | 3 refills | Status: DC
  Filled 2023-10-18: qty 90, 90d supply, fill #0

## 2023-10-18 MED ORDER — VITAMIN E 1000 UNITS PO CAPS
1000.0000 [IU] | ORAL_CAPSULE | Freq: Every day | ORAL | 3 refills | Status: DC
  Filled 2023-10-18: qty 90, 90d supply, fill #0

## 2023-10-18 MED ORDER — TURMERIC 1053 MG PO TABS
1.0000 | ORAL_TABLET | Freq: Every day | ORAL | 3 refills | Status: AC
Start: 2023-10-18 — End: ?
  Filled 2023-10-18: qty 90, fill #0

## 2023-10-18 MED ORDER — K2-D3 5000 5000-90 UNIT-MCG PO CAPS
1.0000 | ORAL_CAPSULE | Freq: Every day | ORAL | 3 refills | Status: DC
  Filled 2023-10-18: qty 90, fill #0

## 2023-10-18 NOTE — Progress Notes (Unsigned)
 [facility]  Phone: [phone]  - Medical Office Visit -  Visit Date: 10/18/2023 Patient: Joseph Cummings   DOB: [birth year]   68 y.o. Male  MRN: [id number] Patient Care Team: Ulyess Gammons, NP as PCP - General (Adult Health Nurse Practitioner) Trudy Fusi, DO as Consulting Physician (Allergy) Today's Health Care Provider: Anthon Kins, MD  ===========================================   Chief Complaint / Reason for Visit: new pt (Pt is present for est care with pcp. Fasting labs today as well.)  Background: 68 y.o. male who has Pruritus and Rash and other nonspecific skin eruption on their problem list.  History of Present Illness    Problem overviews updated today: No problems updated.  Medications updated/reviewed: Current Outpatient Medications on File Prior to Visit  Medication Sig   cholecalciferol (VITAMIN D3) 25 MCG (1000 UNIT) tablet Take 1,000 Units by mouth daily.   fexofenadine (ALLEGRA) 180 MG tablet Take 180 mg by mouth daily.   magnesium 30 MG tablet Take 30 mg by mouth daily.    metFORMIN  (GLUCOPHAGE ) 500 MG tablet Take 500 mg by mouth daily with breakfast.   Omega-3 Fatty Acids  (FISH OIL) 1000 MG CAPS Take 1 capsule by mouth daily.   pyridOXINE  (B-6) 50 MG tablet Take 50 mg by mouth daily.    rosuvastatin  (CRESTOR ) 10 MG tablet Take 10 mg by mouth daily.   TURMERIC PO Take by mouth.   vitamin E  1000 UNIT capsule Take 1,000 Units by mouth daily.   Zinc Citrate-Phytase 25-500 MG CAPS Take 1 capsule by mouth daily.    triamcinolone  ointment (KENALOG ) 0.1 % Apply 1 application topically 2 (two) times daily. Below the neck up to 3 weeks at a time.   [DISCONTINUED] budesonide -formoterol  (SYMBICORT ) 160-4.5 MCG/ACT inhaler Inhale 2 puffs into the lungs 2 (two) times daily.   No current facility-administered medications on file prior to visit.   Medications Discontinued During This Encounter  Medication Reason   LORazepam  (ATIVAN ) 0.5 MG  tablet Completed Course   albuterol  (VENTOLIN  HFA) 108 (90 Base) MCG/ACT inhaler Completed Course   Fluorouracil  2 % SOLN Completed Course   Current Meds  Medication Sig   cholecalciferol (VITAMIN D3) 25 MCG (1000 UNIT) tablet Take 1,000 Units by mouth daily.   fexofenadine (ALLEGRA) 180 MG tablet Take 180 mg by mouth daily.   magnesium 30 MG tablet Take 30 mg by mouth daily.    metFORMIN  (GLUCOPHAGE ) 500 MG tablet Take 500 mg by mouth daily with breakfast.   Omega-3 Fatty Acids  (FISH OIL) 1000 MG CAPS Take 1 capsule by mouth daily.   pyridOXINE  (B-6) 50 MG tablet Take 50 mg by mouth daily.    rosuvastatin  (CRESTOR ) 10 MG tablet Take 10 mg by mouth daily.   TURMERIC PO Take by mouth.   vitamin E  1000 UNIT capsule Take 1,000 Units by mouth daily.   Zinc Citrate-Phytase 25-500 MG CAPS Take 1 capsule by mouth daily.     Allergies:   Allergies as of 10/18/2023 - Review Complete 10/18/2023  Allergen Reaction Noted   Sulfa antibiotics Anaphylaxis 07/31/2016   Past Medical History:  has a past medical history of Arthritis, Diabetes mellitus (HCC) (01/2018), History of kidney stones, and Hypertriglyceridemia (07/2016). Past Surgical History:   has a past surgical history that includes Kidney stone surgery. Social History:   reports that he quit smoking about 32 years ago. His smoking use included cigarettes. He started smoking about 42 years ago. He has a 10 pack-year smoking  history. He has never used smokeless tobacco. He reports current alcohol use. He reports that he does not use drugs. Family History:  family history includes Arthritis in his father; Heart disease in his father; Hyperlipidemia in his father and mother; Hypertension in his father and mother. Depression Screen and Health Maintenance:    10/18/2023    9:16 AM 01/17/2018    1:08 PM  PHQ 2/9 Scores  PHQ - 2 Score 0 0  PHQ- 9 Score 0 0   Health Maintenance  Topic Date Due   Medicare Annual Wellness (AWV)  Never done    DTaP/Tdap/Td (1 - Tdap) Never done   Pneumonia Vaccine 59+ Years old (1 of 2 - PCV) Never done   Colonoscopy  Never done   Zoster Vaccines- Shingrix (2 of 2) 10/13/2016   COVID-19 Vaccine (2 - 2024-25 season) 11/03/2023 (Originally 02/17/2023)   INFLUENZA VACCINE  01/17/2024   Hepatitis C Screening  Completed   HPV VACCINES  Aged Out   Meningococcal B Vaccine  Aged Out   Immunization History  Administered Date(s) Administered   Zoster Recombinant(Shingrix) 08/18/2016     Objective   Physical ExamBP 122/76   Pulse 69   Temp 97.7 F (36.5 C) (Temporal)   Ht 5\' 11"  (1.803 m)   Wt 214 lb 3.2 oz (97.2 kg)   SpO2 98%   BMI 29.87 kg/m  Wt Readings from Last 10 Encounters:  10/18/23 214 lb 3.2 oz (97.2 kg)  11/11/20 216 lb (98 kg)  01/14/20 (!) 213 lb 1.9 oz (96.7 kg)  11/22/19 214 lb 15.2 oz (97.5 kg)  06/05/18 224 lb (101.6 kg)  01/17/18 219 lb (99.3 kg)  10/31/16 218 lb 8 oz (99.1 kg)  07/31/16 215 lb 8 oz (97.8 kg)  Vital signs reviewed.  Nursing notes reviewed. Weight trend reviewed. Abnormalities and problem-specific physical exam findings:  *** General Appearance:  Well developed, well nourished, well-groomed, healthy-appearing male with Body mass index is 29.87 kg/m. No acute distress appreciable.   Skin: Clear and well-hydrated. Pulmonary:  Normal work of breathing at rest, no respiratory distress apparent. SpO2: 98 %  Musculoskeletal: He demonstrates smooth and coordinated movements throughout all major joints.All extremities are intact.  Neurological:  Awake, alert, oriented, and engaged.  No obvious focal neurological deficits or cognitive impairments.  Sensorium seems unclouded.  Psychiatric:  Appropriate mood, pleasant and cooperative demeanor, cheerful and engaged during the exam  Reviewed Results & Data Results  {Insert previous labs (optional):23779} {See past labs  Heme  Chem  Endocrine  Serology  Results Review (optional):1}  No results found for any  visits on 10/18/23.  No visits with results within 1 Year(s) from this visit.  Latest known visit with results is:  Office Visit on 11/11/2020  Component Date Value   WBC 11/11/2020 5.6    RBC 11/11/2020 4.98    Hemoglobin 11/11/2020 15.6    HCT 11/11/2020 45.0    MCV 11/11/2020 90.4    MCHC 11/11/2020 34.7    RDW 11/11/2020 13.5    Platelets 11/11/2020 164.0    Neutrophils Relative % 11/11/2020 60.3    Lymphocytes Relative 11/11/2020 26.3    Monocytes Relative 11/11/2020 10.6    Eosinophils Relative 11/11/2020 1.8    Basophils Relative 11/11/2020 1.0    Neutro Abs 11/11/2020 3.4    Lymphs Abs 11/11/2020 1.5    Monocytes Absolute 11/11/2020 0.6    Eosinophils Absolute 11/11/2020 0.1    Basophils Absolute 11/11/2020 0.1  Sodium 11/11/2020 139    Potassium 11/11/2020 4.2    Chloride 11/11/2020 105    CO2 11/11/2020 25    Glucose, Bld 11/11/2020 136 (H)    BUN 11/11/2020 19    Creatinine, Ser 11/11/2020 0.89    Total Bilirubin 11/11/2020 0.8    Alkaline Phosphatase 11/11/2020 74    AST 11/11/2020 21    ALT 11/11/2020 26    Total Protein 11/11/2020 6.6    Albumin 11/11/2020 4.1    GFR 11/11/2020 89.79    Calcium  11/11/2020 9.2    Cholesterol 11/11/2020 183    Triglycerides 11/11/2020 190.0 (H)    HDL 11/11/2020 49.50    VLDL 11/11/2020 38.0    LDL Cholesterol 11/11/2020 95    Total CHOL/HDL Ratio 11/11/2020 4    NonHDL 11/11/2020 133.13    Hgb A1c MFr Bld 11/11/2020 6.9 (H)    TSH 11/11/2020 1.09    PSA 11/11/2020 0.37    No image results found.   No results found.  MR Brain Wo Contrast (neuro protocol) Result Date: 11/22/2019 CLINICAL DATA:  Ataxia, stroke suspected. Additional history obtained from electronic MEDICAL RECORD NUMBERDizziness EXAM: MRI HEAD WITHOUT CONTRAST TECHNIQUE: Multiplanar, multiecho pulse sequences of the brain and surrounding structures were obtained without intravenous contrast. COMPARISON:  No pertinent prior studies available for comparison.  FINDINGS: Brain: Cerebral volume is normal for age. There is no significant white matter disease for age. There is no acute infarct. No evidence of intracranial mass. No chronic intracranial blood products. No extra-axial fluid collection. No midline shift. Vascular: Expected proximal arterial flow voids. Skull and upper cervical spine: No focal marrow lesion. Sinuses/Orbits: Visualized orbits show no acute finding. Trace ethmoid sinus mucosal thickening. Tiny left maxillary sinus mucous retention cyst. No significant mastoid effusion. IMPRESSION: Unremarkable MRI appearance of the brain for age. No evidence of acute intracranial abnormality, including acute infarction. Minimal ethmoid sinus mucosal thickening. Tiny left maxillary sinus mucous retention cyst. Electronically Signed   By: Bascom Lily DO   On: 11/22/2019 15:46        Assessment & Plan Hypertriglyceridemia  Type 2 diabetes mellitus without complication, without long-term current use of insulin (HCC)  Hyperlipidemia, unspecified hyperlipidemia type  Vitamin deficiency  Assessment and Plan Assessment & Plan    ED Discharge Orders     None     Diagnoses and all orders for this visit: Hypertriglyceridemia   Recommended follow up: No follow-ups on file.No future appointments.       Additional notes: This document was synthesized by artificial intelligence (Abridge) using HIPAA-compliant recording of the clinical interaction;   We discussed the use of AI scribe software for clinical note transcription with the patient, who gave verbal consent to proceed.    Additional Info: This encounter employed state-of-the-art, real-time, collaborative documentation. The patient actively reviewed and assisted in updating their electronic medical record on a shared screen, ensuring transparency and facilitating joint problem-solving for the problem list, overview, and plan. This approach promotes accurate, informed care. The treatment  plan was discussed and reviewed in detail, including medication safety, potential side effects, and all patient questions. We confirmed understanding and comfort with the plan. Follow-up instructions were established, including contacting the office for any concerns, returning if symptoms worsen, persist, or new symptoms develop, and precautions for potential emergency department visits.  Initial Appointment Goals:  This initial visit focused on establishing a foundation for the patient's care. We collaboratively reviewed his medical history and medications in detail, updating the  chart as shown in the encounter. Given the extensive information, we prioritized addressing his most pressing concerns, which he reported were: new pt (Pt is present for est care with pcp. Fasting labs today as well.)  While the complexity of the patient's medical picture may necessitate further evaluation in subsequent visits, we were able to develop a preliminary care plan together. To expedite a comprehensive plan at the next visit, we encouraged the patient to gather relevant medical records from previous providers. This collaborative approach will ensure a more complete understanding of the patient's health and inform the development of a personalized care plan. We look forward to continuing the conversation and working together with the patient on achieving his health goals.   Collaborative Documentation:  Today's encounter utilized real-time, dynamic patient engagement.  Patients actively participate by directly reviewing and assisting in updating their medical records through a shared screen. This transparency empowers patients to visually confirm chart updates made by the healthcare provider.  This collaborative approach facilitates problem management as we jointly update the problem list, problem overview, and assessment/plan. Ultimately, this process enhances chart accuracy and completeness, fostering shared  decision-making, patient education, and informed consent for tests and treatments.  Collaborative Treatment Planning:  Treatment plans were discussed and reviewed in detail.  Explained medication safety and potential side effects.  Encouraged participation and answered all patient questions, confirming understanding and comfort with the plan. Encouraged patient to contact our office if they have any questions or concerns. Agreed on patient returning to office if symptoms worsen, persist, or new symptoms develop.  ----------------------------------------------------- Anthon Kins, MD  10/18/2023 9:54 AM  [facility]:  [phone]

## 2023-10-18 NOTE — Patient Instructions (Signed)
 MEDCENTER [place] [facility] P: [phone] F: [phone] Address  [address]  [place] [address]  Near the intersection of: [place] [facility]   Operation    Hours: Mon-Fri 7:30am-6pm; Sat 8:00am-4:30p  E-Prescribing  E-Prescribing controlled substances  Mode: Retail  Type: Integrated    They can set up mail order at [facility].  What We Discussed Today Thank you for establishing care with our practice. We discussed your health history and management of several ongoing conditions: Type 2 Diabetes: Your A1c today was 8.0%, indicating your diabetes needs better control. We're increasing your metformin  to twice daily and exploring adding a new medication (Farxiga ) if coverage is reasonable. Cholesterol Management: We're restarting your rosuvastatin  10mg  daily to maintain your good LDL control. Your triglycerides are elevated at 334 mg/dL, and we discussed continuing fish oil supplements. Kidney Health: You haven't had kidney stones in 10 years, which is excellent. We've ordered a microalbumin test today to check your kidney function. Continue your preventive measures with lemon water. Vitamin Supplements: We support your current regimen including vitamin D3 with K2, which is an excellent combination. We're checking if insurance might cover prescription versions. Medications Updated Today Metformin  500mg  - Take 1 tablet TWICE daily with meals Rosuvastatin  10mg  - Take 1 tablet daily Fish Oil 1000mg  - Take 1 capsule daily Vitamin D3/K2 (5000IU/90mcg) - Take 1 tablet daily Other supplements continued as before (B6, vitamin E , turmeric, zinc) Farxiga  5mg  - Sent to pharmacy for price check only; do not fill unless discussed Tests Completed Today Comprehensive Metabolic Panel Complete Blood Count Lipid Panel Hemoglobin A1c Urine Microalbumin Your Next Steps Pick up medications at the community pharmacy (/[facility] preferred) Check  MyChart in 1-2 days for test results Wait for our message about potential medication adjustments based on today's labs Schedule a diabetic eye exam Return for follow-up visit in 6 months Health Maintenance Reminders COVID-19 vaccine due: 11/03/2023 Influenza vaccine due: 01/17/2024 Next Cologuard screening due: May 2026 Due for: Annual Medicare Wellness Visit, diabetic foot exam, pneumococcal vaccine Thank you for choosing our practice for your healthcare needs. Please call our office or message through MyChart if you have any questions before your next visit.  Welcome aboard!   Today's visit was a valuable first step in understanding your health and starting your personalized care journey. We discussed your medical history and medications in detail. Given the extensive information, we prioritized addressing your most pressing concerns.  We understood those concerns to be:  new pt (Pt is present for est care with pcp. Fasting labs today as well.)   Building a Complete Picture  To create the most effective care plan possible, we may need additional information from previous providers. We encouraged you to gather any relevant medical records for your next visit. This will help us  build a more complete picture and develop a personalized plan together. In the meantime, we'll address your immediate concerns and provide resources to help you manage all of your medical issues.  We encourage you to use MyChart to review these efforts, and to help us  find and correct any omissions or errors in your medical chart.  Managing Your Health Over Time  Managing every aspect of your health in a single visit isn't always feasible, but that's okay.  We addressed your most pressing concerns today and charted a course for future care. Acute conditions or preventive care measures may require further attention.  We encourage you to schedule a follow-up visit at your  earliest convenience to discuss any unresolved issues.   We strongly encourage participation in annual preventive care visits to help us  develop a more thorough understanding of your health and to help you maintain optimal wellness - please inquire about scheduling your next one with us  at your earliest convenience.  Your Satisfaction Matters  It was a pleasure seeing you today!  Your health and satisfaction will always be my top priorities. If you believe your experience today was worthy of a 5-star rating, I'd be grateful for your feedback!  Anthon Kins, MD   Next Steps  Schedule Follow-Up:  We recommend a follow-up appointment in No follow-ups on file. If your condition worsens before then, please call us  or seek emergency care. Preventive Care:  Don't forget to schedule your annual preventive care visit!  This important checkup is typically covered by insurance and helps identify potential health issues early.  Typically its 100% insurance covered with no co-pay and helps to get surveillance labwork paid for through your insurance provider.  Sometimes it even lowers your insurance premiums to participate. Medical Information Release:  For any relevant medical information we don't have, please sign a release form so we can obtain it for your records. Lab & X-ray Appointments:  Scheduled any incomplete lab tests today or call us  to schedule.  X-Rays can be done without an appointment at [facility] at [facility] ([address]), M-F 8:30am-noon or 1pm-5pm.  Just tell them you're there for X-rays ordered by Dr. Boston Byers.  We'll receive the results and contact you by phone or MyChart to discuss next steps.  Bring to Your Next Appointment  Medications: Please bring all your medication bottles to your next appointment to ensure we have an accurate record of your prescriptions. Health Diaries: If you're monitoring any health conditions at home, keeping a diary of your readings can be very helpful for discussions at your next  appointment.  Reviewing Your Records  Please Review this early draft of your clinical notes below and the final encounter summary tomorrow on MyChart after its been completed.   Hypertriglyceridemia -     Fish Oil; Take 1 capsule (1,000 mg total) by mouth daily.  Dispense: 360 capsule; Refill: 3  Type 2 diabetes mellitus without complication, without long-term current use of insulin (HCC) -     CBC with Differential/Platelet -     Comprehensive metabolic panel with GFR -     Lipid panel -     Hemoglobin A1c -     Microalbumin / creatinine urine ratio -     Dapagliflozin  Propanediol; Take 1 tablet (5 mg total) by mouth daily before breakfast.  Dispense: 90 tablet; Refill: 3 -     metFORMIN  HCl; Take 1 tablet (500 mg total) by mouth daily with breakfast.  Dispense: 90 tablet; Refill: 3  Hyperlipidemia, unspecified hyperlipidemia type -     Rosuvastatin  Calcium ; Take 1 tablet (10 mg total) by mouth daily.  Dispense: 90 tablet; Refill: 3  Vitamin deficiency -     K2-D3 5000; Take 1 tablet by mouth daily at 6 (six) AM.  Dispense: 90 capsule; Refill: 3 -     Pyridoxine  HCl; Take 1 tablet (50 mg total) by mouth daily.  Dispense: 90 tablet; Refill: 3 -     Vitamin E ; Take 1 capsule (1,000 Units total) by mouth daily.  Dispense: 90 capsule; Refill: 3 -     Zinc Citrate-Phytase; Take 1 capsule by mouth daily.  Dispense: 90 capsule; Refill: 3 -     Turmeric; Take 1 tablet by mouth daily at 6 (six) AM.  Dispense: 90 tablet; Refill: 3  Personal history of kidney stones     Getting Answers and Following Up  Simple Questions & Concerns: For quick questions or basic follow-up after your visit, reach us  at [phone] or MyChart messaging. Complex Concerns: If your concern is more complex, scheduling an appointment might be best. Discuss this with the staff to find the most suitable option. Lab & Imaging Results: We'll contact you directly if results are abnormal or you don't use MyChart. Most  normal results will be on MyChart within 2-3 business days, with a review message from Dr. Boston Byers. Haven't heard back in 2 weeks? Need results sooner? Contact us  at [phone]. Referrals: Our referral coordinator will manage specialist referrals. The specialist's office should contact you within 2 weeks to schedule an appointment. Call us  if you haven't heard from them after 2 weeks.  Staying Connected  MyChart: Activate your MyChart for the fastest way to access results and message us . See the last page of this paperwork for instructions.  Billing  X-ray & Lab Orders: These are billed by separate companies. Contact the invoicing company directly for questions or concerns. Visit Charges: Discuss any billing inquiries with our administrative services team.  Feedback & Satisfaction  Share Your Experience: We strive for your satisfaction! If you have any complaints, please let Dr. Boston Byers know directly or contact our Practice Administrators, Olinda Bertrand or [employer], by asking at the front desk.  Scheduling Tips  Shorter Wait Times: 8 am and 1 pm appointments often have the quickest wait times. Longer Appointments: If you need more time during your visit, talk to the front desk. Due to insurance regulations, multiple back-to-back appointments might be necessary.

## 2023-10-19 LAB — CBC WITH DIFFERENTIAL/PLATELET
Basophils Absolute: 0.1 10*3/uL (ref 0.0–0.1)
Basophils Relative: 1 % (ref 0.0–3.0)
Eosinophils Absolute: 0.2 10*3/uL (ref 0.0–0.7)
Eosinophils Relative: 3 % (ref 0.0–5.0)
HCT: 43.9 % (ref 39.0–52.0)
Hemoglobin: 15 g/dL (ref 13.0–17.0)
Lymphocytes Relative: 29.5 % (ref 12.0–46.0)
Lymphs Abs: 1.6 10*3/uL (ref 0.7–4.0)
MCHC: 34.1 g/dL (ref 30.0–36.0)
MCV: 91.4 fl (ref 78.0–100.0)
Monocytes Absolute: 0.6 10*3/uL (ref 0.1–1.0)
Monocytes Relative: 11.5 % (ref 3.0–12.0)
Neutro Abs: 3 10*3/uL (ref 1.4–7.7)
Neutrophils Relative %: 55 % (ref 43.0–77.0)
Platelets: 165 10*3/uL (ref 150.0–400.0)
RBC: 4.8 Mil/uL (ref 4.22–5.81)
RDW: 14 % (ref 11.5–15.5)
WBC: 5.4 10*3/uL (ref 4.0–10.5)

## 2023-10-19 NOTE — Assessment & Plan Note (Signed)
 A1c 8.0% today (10/18/2023), increased from 7.1% in September 2024 Current management with metformin  500mg  daily has been insufficient for optimal glycemic control PLAN:  Increase metformin  to 500mg  twice daily to improve glycemic control Send prescription for dapagliflozin  (Farxiga ) 5mg  daily for pharmacy price check and possible addition to regimen If Farxiga  is cost-prohibitive (likely), will focus on optimizing metformin  therapy Discussed benefits of SGLT2 inhibitors for kidney protection and potential A1c reduction Ordered comprehensive metabolic panel and A1c today Patient to monitor home glucose and report any concerns Follow-up lab work in 3 months to assess response to medication changes Emphasized importance of dietary management and regular physical activity as tolerated with current bicep injury

## 2023-10-19 NOTE — Assessment & Plan Note (Signed)
 Triglycerides significantly elevated at 334 mg/dL Currently taking OTC fish oil supplements PLAN:  Continue OTC fish oil 1000mg  daily Discussed prescription icosapent ethyl (Vascepa) but determined cost-prohibitive Dietary counseling on reducing simple carbohydrates and alcohol Recheck triglycerides at next follow-up

## 2023-10-20 ENCOUNTER — Encounter: Payer: Self-pay | Admitting: Internal Medicine

## 2023-10-20 DIAGNOSIS — E785 Hyperlipidemia, unspecified: Secondary | ICD-10-CM | POA: Insufficient documentation

## 2023-10-21 NOTE — Telephone Encounter (Signed)
 read by Alanda Allegra at 12:52AM on 10/21/2023.

## 2023-10-22 ENCOUNTER — Other Ambulatory Visit (HOSPITAL_BASED_OUTPATIENT_CLINIC_OR_DEPARTMENT_OTHER): Payer: Self-pay

## 2023-10-29 ENCOUNTER — Other Ambulatory Visit: Payer: Self-pay

## 2023-10-29 DIAGNOSIS — E119 Type 2 diabetes mellitus without complications: Secondary | ICD-10-CM

## 2023-10-29 MED ORDER — METFORMIN HCL 500 MG PO TABS: 500.0000 mg | ORAL_TABLET | Freq: Two times a day (BID) | ORAL | 3 refills | Status: DC

## 2023-10-31 ENCOUNTER — Ambulatory Visit: Payer: Self-pay | Admitting: Internal Medicine

## 2023-10-31 ENCOUNTER — Other Ambulatory Visit: Payer: Self-pay | Admitting: Family

## 2023-10-31 ENCOUNTER — Ambulatory Visit (INDEPENDENT_AMBULATORY_CARE_PROVIDER_SITE_OTHER): Admitting: Family

## 2023-10-31 VITALS — BP 145/84 | HR 71 | Temp 97.9°F | Ht 71.0 in | Wt 215.2 lb

## 2023-10-31 DIAGNOSIS — H9312 Tinnitus, left ear: Secondary | ICD-10-CM | POA: Diagnosis not present

## 2023-10-31 DIAGNOSIS — H938X2 Other specified disorders of left ear: Secondary | ICD-10-CM

## 2023-10-31 MED ORDER — HYDROCORTISONE-ACETIC ACID 1-2 % OT SOLN: 3.0000 [drp] | Freq: Two times a day (BID) | OTIC | 0 refills | Status: DC

## 2023-10-31 NOTE — Progress Notes (Signed)
 Patient ID: Joseph Cummings, male    DOB: 1955/09/19, 68 y.o.   MRN: [id number]  Chief Complaint  Patient presents with   Ear Fullness    Pt c/o left ear fullness and pressure, Present for 1 month. Has tried at home irrigation.   Discussed the use of AI scribe software for clinical note transcription with the patient, who gave verbal consent to proceed.  History of Present Illness Joseph Cummings is a 68 year old male who presents with left ear buzzing and pressure.  He experiences a continuous buzzing sound in his left ear for the past one to two months, occurring constantly. Initially, there was slight pressure in the ear before the buzzing began. He has a history of earwax buildup, treated with irrigation 25 years ago, and uses a home kit with warm water and peroxide for ear cleaning. He also uses Q-tips for ear cleaning. There is no ear pain.  He had been exposed to loud noises, such as at shooting ranges, without ear protection. He denies any hearing loss in the left ear, and the buzzing is only present in the left ear.  Recently, he experienced jaw pain on the left side associated with chewing, which resolved and is not currently tender.   Assessment & Plan Tinnitus/ear pressure - Chronic left ear buzzing for 1-2 months, likely due to past noise exposure or otitis effusion.  Ear canal swelling hinders tympanic membrane assessment, possibly from cotton swab use. - Prescribed otic drops for swelling. - Avoid use of Q-tips for next 2 weeks. - Advised follow-up in 10d-2 weeks for reassessment and possible ENT referral. - Discussed potential audiologist referral for audiometry if symptoms persist.   Subjective:     Outpatient Medications Prior to Visit  Medication Sig Dispense Refill   fexofenadine (ALLEGRA) 180 MG tablet Take 180 mg by mouth daily.     magnesium 30 MG tablet Take 30 mg by mouth daily.      metFORMIN  (GLUCOPHAGE ) 500 MG tablet Take 1 tablet (500 mg total) by mouth 2  (two) times daily with a meal. 180 tablet 3   rosuvastatin  (CRESTOR ) 10 MG tablet Take 1 tablet (10 mg total) by mouth daily. 90 tablet 3   triamcinolone  ointment (KENALOG ) 0.1 % Apply 1 application topically 2 (two) times daily. Below the neck up to 3 weeks at a time. 453.6 g 1   Turmeric 1053 MG TABS Take 1 tablet by mouth daily at 6 (six) AM. 90 tablet 3   Zinc  Citrate-Phytase 25-500 MG CAPS Take 1 capsule by mouth daily. 90 capsule 3   dapagliflozin  propanediol (FARXIGA ) 5 MG TABS tablet Take 1 tablet (5 mg total) by mouth daily before breakfast. 90 tablet 3   Omega-3 Fatty Acids  (FISH OIL ) 1000 MG CAPS Take 1 capsule (1,000 mg total) by mouth daily. 360 capsule 3   pyridOXINE  (B-6) 50 MG tablet Take 1 tablet (50 mg total) by mouth daily. 90 tablet 3   Vitamin D -Vitamin K (K2-D3 5000) 5000-90 UNIT-MCG CAPS Take 1 tablet by mouth daily at 6 (six) AM. 90 capsule 3   vitamin E  1000 UNIT capsule Take 1 capsule (1,000 Units total) by mouth daily. 90 capsule 3   No facility-administered medications prior to visit.   Past Medical History:  Diagnosis Date   Arthritis    Diabetes mellitus (HCC) 01/2018   07/2016 PREDIABETES: Fasting gluc 120.  HbA1c 6.1%.  Then A1c 6.5% 01/2018:  pt declined nutritionist referral and my  recommendation to start metformin  01/20/18.   Fracture of metacarpal bone 10/12/2021   History of kidney stones    Hypertriglyceridemia 07/2016   mild   Past Surgical History:  Procedure Laterality Date   KIDNEY STONE SURGERY     ureteroscopy with extraction approx 2015   Allergies  Allergen Reactions   Sulfa Antibiotics Anaphylaxis   Formaldehyde Dermatitis      Objective:    Physical Exam Vitals and nursing note reviewed.  Constitutional:      General: He is not in acute distress.    Appearance: Normal appearance.  HENT:     Head: Normocephalic.     Right Ear: Swelling (w/mild erythema, unable to visualize TM well) present.     Left Ear: Swelling (w/mild erythema,  unable to visualize TM well) present.  Cardiovascular:     Rate and Rhythm: Normal rate and regular rhythm.  Pulmonary:     Effort: Pulmonary effort is normal.     Breath sounds: Normal breath sounds.  Musculoskeletal:        General: Normal range of motion.     Cervical back: Normal range of motion.  Skin:    General: Skin is warm and dry.  Neurological:     Mental Status: He is alert and oriented to person, place, and time.  Psychiatric:        Mood and Affect: Mood normal.    BP (!) 145/84 (BP Location: Left Arm, Patient Position: Sitting, Cuff Size: Large)   Pulse 71   Temp 97.9 F (36.6 C) (Temporal)   Ht 5\' 11"  (1.803 m)   Wt 215 lb 3.2 oz (97.6 kg)   SpO2 95%   BMI 30.01 kg/m  Wt Readings from Last 3 Encounters:  10/31/23 215 lb 3.2 oz (97.6 kg)  10/18/23 214 lb 3.2 oz (97.2 kg)  11/11/20 216 lb (98 kg)       Versa Gore, NP

## 2023-10-31 NOTE — Progress Notes (Signed)
 Everything pretty normal on labs but with Hemoglobin A1c 7.1 might want to increase metformin  to two tablets twice daily with meals from just 1 tablet.  Call it in if he agrees.

## 2023-11-12 ENCOUNTER — Other Ambulatory Visit (HOSPITAL_BASED_OUTPATIENT_CLINIC_OR_DEPARTMENT_OTHER): Payer: Self-pay

## 2023-11-15 ENCOUNTER — Ambulatory Visit: Admitting: Internal Medicine

## 2024-01-20 ENCOUNTER — Ambulatory Visit: Admitting: Internal Medicine

## 2024-02-04 ENCOUNTER — Ambulatory Visit: Admitting: Internal Medicine

## 2024-02-05 ENCOUNTER — Ambulatory Visit: Admitting: Internal Medicine

## 2024-02-21 ENCOUNTER — Ambulatory Visit (INDEPENDENT_AMBULATORY_CARE_PROVIDER_SITE_OTHER): Admitting: Internal Medicine

## 2024-02-21 ENCOUNTER — Other Ambulatory Visit (HOSPITAL_BASED_OUTPATIENT_CLINIC_OR_DEPARTMENT_OTHER): Payer: Self-pay

## 2024-02-21 ENCOUNTER — Encounter: Payer: Self-pay | Admitting: Internal Medicine

## 2024-02-21 VITALS — BP 130/76 | HR 80 | Temp 98.0°F | Ht 71.0 in | Wt 215.4 lb

## 2024-02-21 DIAGNOSIS — E781 Pure hyperglyceridemia: Secondary | ICD-10-CM | POA: Diagnosis not present

## 2024-02-21 DIAGNOSIS — E1165 Type 2 diabetes mellitus with hyperglycemia: Secondary | ICD-10-CM | POA: Diagnosis not present

## 2024-02-21 DIAGNOSIS — Z8249 Family history of ischemic heart disease and other diseases of the circulatory system: Secondary | ICD-10-CM

## 2024-02-21 DIAGNOSIS — E782 Mixed hyperlipidemia: Secondary | ICD-10-CM | POA: Diagnosis not present

## 2024-02-21 DIAGNOSIS — Z7984 Long term (current) use of oral hypoglycemic drugs: Secondary | ICD-10-CM

## 2024-02-21 LAB — COMPREHENSIVE METABOLIC PANEL WITH GFR
ALT: 33 U/L (ref 0–53)
AST: 27 U/L (ref 0–37)
Albumin: 4.1 g/dL (ref 3.5–5.2)
Alkaline Phosphatase: 82 U/L (ref 39–117)
BUN: 16 mg/dL (ref 6–23)
CO2: 26 meq/L (ref 19–32)
Calcium: 9 mg/dL (ref 8.4–10.5)
Chloride: 102 meq/L (ref 96–112)
Creatinine, Ser: 0.95 mg/dL (ref 0.40–1.50)
GFR: 81.96 mL/min (ref >60.00)
Glucose, Bld: 192 mg/dL — ABNORMAL HIGH (ref 70–99)
Potassium: 4.2 meq/L (ref 3.5–5.1)
Sodium: 138 meq/L (ref 135–145)
Total Bilirubin: 0.7 mg/dL (ref 0.2–1.2)
Total Protein: 7 g/dL (ref 6.0–8.3)

## 2024-02-21 LAB — CBC WITH DIFFERENTIAL/PLATELET
Basophils Absolute: 0 K/uL (ref 0.0–0.1)
Basophils Relative: 1 % (ref 0.0–3.0)
Eosinophils Absolute: 0.1 K/uL (ref 0.0–0.7)
Eosinophils Relative: 2.3 % (ref 0.0–5.0)
HCT: 43.8 % (ref 39.0–52.0)
Hemoglobin: 15 g/dL (ref 13.0–17.0)
Lymphocytes Relative: 30.5 % (ref 12.0–46.0)
Lymphs Abs: 1.4 K/uL (ref 0.7–4.0)
MCHC: 34.2 g/dL (ref 30.0–36.0)
MCV: 91 fl (ref 78.0–100.0)
Monocytes Absolute: 0.5 K/uL (ref 0.1–1.0)
Monocytes Relative: 10.1 % (ref 3.0–12.0)
Neutro Abs: 2.6 K/uL (ref 1.4–7.7)
Neutrophils Relative %: 56.1 % (ref 43.0–77.0)
Platelets: 150 K/uL (ref 150.0–400.0)
RBC: 4.81 Mil/uL (ref 4.22–5.81)
RDW: 13.7 % (ref 11.5–15.5)
WBC: 4.6 K/uL (ref 4.0–10.5)

## 2024-02-21 LAB — HEMOGLOBIN A1C: Hgb A1c MFr Bld: 8.2 % — ABNORMAL HIGH (ref 4.6–6.5)

## 2024-02-21 LAB — LIPID PANEL
Cholesterol: 201 mg/dL — ABNORMAL HIGH (ref 0–200)
HDL: 47 mg/dL (ref >39.00)
LDL Cholesterol: 75 mg/dL (ref 0–99)
NonHDL: 154.41
Total CHOL/HDL Ratio: 4
Triglycerides: 398 mg/dL — ABNORMAL HIGH (ref 0.0–149.0)
VLDL: 79.6 mg/dL — ABNORMAL HIGH (ref 0.0–40.0)

## 2024-02-21 LAB — MICROALBUMIN / CREATININE URINE RATIO
Creatinine,U: 139.5 mg/dL
Microalb Creat Ratio: 6.4 mg/g (ref 0.0–30.0)
Microalb, Ur: 0.9 mg/dL (ref 0.0–1.9)

## 2024-02-21 MED ORDER — FREESTYLE LIBRE 3 PLUS SENSOR MISC
11 refills | Status: AC
  Filled 2024-02-21: qty 2, 30d supply, fill #0

## 2024-02-21 NOTE — Patient Instructions (Addendum)
  [email or web address] [facility] [address] [place], [place] [address] Phone:[phone]

## 2024-02-21 NOTE — Progress Notes (Signed)
 ==============================  [facility]: [phone]   -- Medical Office Visit --  Patient: Joseph Cummings      Age: 68 y.o.       Sex:  male  Date:   02/21/2024 Today's Healthcare Provider: Bernardino KANDICE [facility], MD  ==============================   Chief Complaint: Hyperlipidemia (Pt is here to have labs today as well he is fasting for them)   Discussed the use of AI scribe software for clinical note transcription with the patient, who gave verbal consent to proceed.  History of Present Illness 68 year old male who presents for cardiovascular health evaluation.  He is concerned about his cardiovascular health due to a family history of heart disease. His father had a quadruple bypass in his late fifties and had a history of hypertension. He mentions friends who have had stents placed due to clogged arteries and wants to ensure he is maintaining good heart health. No dizziness, chest pain, or shortness of breath. He can stand up quickly without experiencing dizziness, which he previously thought was a sign of hypertension.  He describes his physical activity level as being able to walk and fast walk, noting that he recuperates quickly after exertion. He can sprint for a short distance and recover his breath within a minute or two, which he considers a good sign of his cardiovascular fitness.  He is currently taking metformin  once daily for diabetes management. He previously attempted to take it twice daily but experienced gastrointestinal side effects such as diarrhea and constipation. He is concerned about his A1c levels, which were high at the last check, and is due for a blood test today to reassess them.  He mentions a past use of statins but stopped due to concerns about side effects. He is interested in exploring other options for managing his cholesterol and heart health, including non-statin medications and supplements. He is motivated to  maintain his health to watch his children grow old and his grandchildren grow up.  Lab Results  Component Value Date   HGBA1C 8.0 (H) 10/18/2023   HGBA1C 6.9 (H) 11/11/2020   HGBA1C 6.5 01/17/2018   HGBA1C 6.1 08/01/2016   Lab Results  Component Value Date   LDLCALC 72 10/18/2023   LDLCALC 95 11/11/2020   Background Reviewed: Problem List: has Pruritus; Rash and other nonspecific skin eruption; Hypertriglyceridemia; Type 2 diabetes mellitus with hyperglycemia (HCC); and Hyperlipidemia on their problem list. Past Medical History:  has a past medical history of Arthritis, Diabetes mellitus (HCC) (01/2018), Fracture of metacarpal bone (10/12/2021), History of kidney stones, and Hypertriglyceridemia (07/2016). Past Surgical History:   has a past surgical history that includes Kidney stone surgery. Social History:   reports that he quit smoking about 32 years ago. His smoking use included cigarettes. He started smoking about 42 years ago. He has a 10 pack-year smoking history. He has never used smokeless tobacco. He reports current alcohol use. He reports that he does not use drugs. Family History:  family history includes Arthritis in his father; Heart disease in his father; Hyperlipidemia in his father and mother; Hypertension in his father and mother. Allergies:  is allergic to sulfa antibiotics and formaldehyde.   Medication Reconciliation: Current Outpatient Medications on File Prior to Visit  Medication Sig   fexofenadine (ALLEGRA) 180 MG tablet Take 180 mg by mouth daily.   magnesium 30 MG tablet Take 30 mg by mouth daily.    metFORMIN  (GLUCOPHAGE ) 500 MG tablet Take 1 tablet (  500 mg total) by mouth 2 (two) times daily with a meal.   neomycin-polymyxin-hydrocortisone  (CORTISPORIN) OTIC solution Place 3 drops into both ears 2 (two) times daily.   Turmeric 1053 MG TABS Take 1 tablet by mouth daily at 6 (six) AM.   Zinc  Citrate-Phytase 25-500 MG CAPS Take 1 capsule by mouth daily.    rosuvastatin  (CRESTOR ) 10 MG tablet Take 1 tablet (10 mg total) by mouth daily. (Patient not taking: Reported on 02/21/2024)   triamcinolone  ointment (KENALOG ) 0.1 % Apply 1 application topically 2 (two) times daily. Below the neck up to 3 weeks at a time.   [DISCONTINUED] budesonide -formoterol  (SYMBICORT ) 160-4.5 MCG/ACT inhaler Inhale 2 puffs into the lungs 2 (two) times daily.   No current facility-administered medications on file prior to visit.  There are no discontinued medications.   Physical Exam:    02/21/2024   10:00 AM 10/31/2023    9:37 AM 10/18/2023    9:06 AM  Vitals with BMI  Height 5' 11 5' 11 5' 11  Weight 215 lbs 6 oz 215 lbs 3 oz 214 lbs 3 oz  BMI 30.06 30.03 29.89  Systolic 130 145 877  Diastolic 76 84 76  Pulse 80 71 69  Vital signs reviewed.  Nursing notes reviewed. Weight trend reviewed. Physical Activity: Insufficiently Active (10/30/2023)   Exercise Vital Sign    Days of Exercise per Week: 2 days    Minutes of Exercise per Session: 20 min   General Appearance:  No acute distress appreciable.   Well-groomed, healthy-appearing male.  Well proportioned with no abnormal fat distribution.  Good muscle tone. Pulmonary:  Normal work of breathing at rest, no respiratory distress apparent. SpO2: 96 %  Musculoskeletal: All extremities are intact.  Neurological:  Awake, alert, oriented, and engaged.  No obvious focal neurological deficits or cognitive impairments.  Sensorium seems unclouded.   Speech is clear and coherent with logical content. Psychiatric:  Appropriate mood, pleasant and cooperative demeanor, thoughtful and engaged during the exam   Verbalized to patient: Physical Exam CARDIOVASCULAR: Normal heart sounds with no valve abnormalities.   Results:   Verbalized to patient: Results LABS   Cholesterol: 72 mg/dL     0/09/7972   89:90 AM 10/18/2023    9:16 AM 01/17/2018    1:08 PM  PHQ 2/9 Scores  PHQ - 2 Score 0 0 0  PHQ- 9 Score  0 0   Scanned  Document on 10/30/2023  Component Date Value Ref Range Status   A1c 02/20/2023 7.1   Final   EGFR 02/20/2023 88.0   Final   EGFR 10/31/2022 87.0   Final   Microalb Creat Ratio 10/31/2022 11.0   Final   Creatinine, POC 10/31/2022 64  mg/dL Final   Microalbumin, Urine 10/31/2022 0.7   Final   Cologuard 11/01/2021 Negative  Negative Final   A1c 10/31/2022 7.2   Final  Office Visit on 10/18/2023  Component Date Value Ref Range Status   WBC 10/18/2023 5.4  4.0 - 10.5 K/uL Final   RBC 10/18/2023 4.80  4.22 - 5.81 Mil/uL Final   Hemoglobin 10/18/2023 15.0  13.0 - 17.0 g/dL Final   HCT 94/97/7974 43.9  39.0 - 52.0 % Final   MCV 10/18/2023 91.4  78.0 - 100.0 fl Final   MCHC 10/18/2023 34.1  30.0 - 36.0 g/dL Final   RDW 94/97/7974 14.0  11.5 - 15.5 % Final   Platelets 10/18/2023 165.0  150.0 - 400.0 K/uL Final   Neutrophils Relative %  10/18/2023 55.0  43.0 - 77.0 % Final   Lymphocytes Relative 10/18/2023 29.5  12.0 - 46.0 % Final   Monocytes Relative 10/18/2023 11.5  3.0 - 12.0 % Final   Eosinophils Relative 10/18/2023 3.0  0.0 - 5.0 % Final   Basophils Relative 10/18/2023 1.0  0.0 - 3.0 % Final   Neutro Abs 10/18/2023 3.0  1.4 - 7.7 K/uL Final   Lymphs Abs 10/18/2023 1.6  0.7 - 4.0 K/uL Final   Monocytes Absolute 10/18/2023 0.6  0.1 - 1.0 K/uL Final   Eosinophils Absolute 10/18/2023 0.2  0.0 - 0.7 K/uL Final   Basophils Absolute 10/18/2023 0.1  0.0 - 0.1 K/uL Final   Sodium 10/18/2023 139  135 - 145 mEq/L Final   Potassium 10/18/2023 4.4  3.5 - 5.1 mEq/L Final   Chloride 10/18/2023 102  96 - 112 mEq/L Final   CO2 10/18/2023 24  19 - 32 mEq/L Final   Glucose, Bld 10/18/2023 127 (H)  70 - 99 mg/dL Final   BUN 94/97/7974 14  6 - 23 mg/dL Final   Creatinine, Ser 10/18/2023 0.88  0.40 - 1.50 mg/dL Final   Total Bilirubin 10/18/2023 0.7  0.2 - 1.2 mg/dL Final   Alkaline Phosphatase 10/18/2023 72  39 - 117 U/L Final   AST 10/18/2023 18  0 - 37 U/L Final   ALT 10/18/2023 23  0 - 53 U/L Final    Total Protein 10/18/2023 7.1  6.0 - 8.3 g/dL Final   Albumin 94/97/7974 4.4  3.5 - 5.2 g/dL Final   GFR 94/97/7974 88.26  >60.00 mL/min Final   Calcium  10/18/2023 9.5  8.4 - 10.5 mg/dL Final   Cholesterol 94/97/7974 184  0 - 200 mg/dL Final   Triglycerides 94/97/7974 334.0 (H)  0.0 - 149.0 mg/dL Final   HDL 94/97/7974 45.60  >39.00 mg/dL Final   VLDL 94/97/7974 66.8 (H)  0.0 - 40.0 mg/dL Final   LDL Cholesterol 10/18/2023 72  0 - 99 mg/dL Final   Total CHOL/HDL Ratio 10/18/2023 4   Final   NonHDL 10/18/2023 138.82   Final   Hgb A1c MFr Bld 10/18/2023 8.0 (H)  4.6 - 6.5 % Final   Microalb, Ur 10/18/2023 <0.7  mg/dL Final   Creatinine,U 94/97/7974 60.2  mg/dL Final   Microalb Creat Ratio 10/18/2023 Unable to calculate  0.0 - 30.0 mg/g Final  No image results found. No results found.       ASSESSMENT & PLAN   Assessment & Plan Moderate mixed hyperlipidemia not requiring statin therapy Hypertriglyceridemia Family history of CABG Mixed hyperlipidemia and pure hyperglyceridemia   He has mixed hyperlipidemia with statin use discontinued due to side effects. Alternative lipid-lowering therapies, including Zetia and red yeast extract, were discussed. Emphasis was placed on cardiovascular risk reduction due to family history of coronary artery disease. A coronary artery disease risk assessment was discussed, with a CT coronary calcium  score as a cost-effective option compared to stress tests. The benefits of coronary bypass surgery in extending life expectancy by an average of 20 years were also discussed. Order Zetia and discuss red yeast extract as an over-the-counter option. Encourage consideration of a CT coronary calcium  score for $100 to assess coronary artery disease risk. Type 2 diabetes mellitus with hyperglycemia, without long-term current use of insulin (HCC) e has type 2 diabetes mellitus with elevated A1c, currently managed with metformin  once daily. He experiences gastrointestinal  side effects with increased dosing. Continuous glucose monitoring was discussed for better management. If  A1c remains high, consider more advanced therapeutics like Ozempic or Mounjaro, with attention to insurance coverage and cost. Check A1c today and order a continuous glucose monitor. Consider increasing metformin  dose if A1c remains high and discuss potential use of Ozempic or Mounjaro. Encourage him to report glucose readings via MyChart.    ORDER ASSOCIATIONS  #   DIAGNOSIS / CONDITION ICD-10 ENCOUNTER ORDER     ICD-10-CM   1. Type 2 diabetes mellitus with hyperglycemia, without long-term current use of insulin (HCC)  E11.65 CBC with Differential/Platelet    Comprehensive metabolic panel with GFR    Lipid panel    Hemoglobin A1c    Microalbumin / creatinine urine ratio    CT CARDIAC SCORING (SELF PAY ONLY)    Continuous Glucose Sensor (FREESTYLE LIBRE 3 PLUS SENSOR) MISC    2. Moderate mixed hyperlipidemia not requiring statin therapy  E78.2 CT CARDIAC SCORING (SELF PAY ONLY)    3. Hypertriglyceridemia  E78.1     4. Family history of CABG  Z82.49            Orders Placed in Encounter:   Lab Orders         CBC with Differential/Platelet         Comprehensive metabolic panel with GFR         Lipid panel         Hemoglobin A1c         Microalbumin / creatinine urine ratio     Imaging Orders         CT CARDIAC SCORING (SELF PAY ONLY)     Meds ordered this encounter  Medications   Continuous Glucose Sensor (FREESTYLE LIBRE 3 PLUS SENSOR) MISC    Sig: Change sensor every 15 days.    Dispense:  2 each    Refill:  11    Orders Placed This Encounter  Procedures   CT CARDIAC SCORING (SELF PAY ONLY)    Standing Status:   Future    Expected Date:   02/28/2024    Expiration Date:   05/22/2024    Preferred imaging location?:   [facility]    Radiology Contrast Protocol - do NOT remove file path:   [email or web address]    Release  to patient:   Immediate   CBC with Differential/Platelet   Comprehensive metabolic panel with GFR    [place]    Has the patient fasted?:   No   Lipid panel    [place]    Has the patient fasted?:   No   Hemoglobin A1c    [place]   Microalbumin / creatinine urine ratio        This document was synthesized by artificial intelligence ([facility]) using HIPAA-compliant recording of the clinical interaction;   We discussed the use of AI scribe software for clinical note transcription with the patient, who gave verbal consent to proceed. additional Info: This encounter employed state-of-the-art, real-time, collaborative documentation. The patient actively reviewed and assisted in updating their electronic medical record on a shared screen, ensuring transparency and facilitating joint problem-solving for the problem list, overview, and plan. This approach promotes accurate, informed care. The treatment plan was discussed and reviewed in detail, including medication safety, potential side effects, and all patient questions. We confirmed understanding and comfort with the plan. Follow-up instructions were established, including contacting the office for any concerns, returning if symptoms worsen, persist, or new symptoms develop, and precautions for potential emergency department visits.

## 2024-02-21 NOTE — Assessment & Plan Note (Signed)
 Mixed hyperlipidemia and pure hyperglyceridemia   He has mixed hyperlipidemia with statin use discontinued due to side effects. Alternative lipid-lowering therapies, including Zetia and red yeast extract, were discussed. Emphasis was placed on cardiovascular risk reduction due to family history of coronary artery disease. A coronary artery disease risk assessment was discussed, with a CT coronary calcium  score as a cost-effective option compared to stress tests. The benefits of coronary bypass surgery in extending life expectancy by an average of 20 years were also discussed. Order Zetia and discuss red yeast extract as an over-the-counter option. Encourage consideration of a CT coronary calcium  score for $100 to assess coronary artery disease risk.

## 2024-02-21 NOTE — Assessment & Plan Note (Signed)
 e has type 2 diabetes mellitus with elevated A1c, currently managed with metformin  once daily. He experiences gastrointestinal side effects with increased dosing. Continuous glucose monitoring was discussed for better management. If A1c remains high, consider more advanced therapeutics like Ozempic or Mounjaro, with attention to insurance coverage and cost. Check A1c today and order a continuous glucose monitor. Consider increasing metformin  dose if A1c remains high and discuss potential use of Ozempic or Mounjaro. Encourage him to report glucose readings via MyChart.

## 2024-02-23 ENCOUNTER — Ambulatory Visit: Payer: Self-pay | Admitting: Internal Medicine

## 2024-02-23 ENCOUNTER — Encounter: Payer: Self-pay | Admitting: Internal Medicine

## 2024-02-23 DIAGNOSIS — E119 Type 2 diabetes mellitus without complications: Secondary | ICD-10-CM

## 2024-02-23 NOTE — Progress Notes (Signed)
 Assist with getting moved forward the next already scheduled appointment with us :  05/22/2024

## 2024-02-25 ENCOUNTER — Other Ambulatory Visit: Payer: Self-pay | Admitting: Internal Medicine

## 2024-02-25 DIAGNOSIS — E785 Hyperlipidemia, unspecified: Secondary | ICD-10-CM

## 2024-02-29 MED ORDER — METFORMIN HCL 500 MG PO TABS: 1000.0000 mg | ORAL_TABLET | Freq: Two times a day (BID) | ORAL | 3 refills | Status: DC

## 2024-02-29 NOTE — Addendum Note (Signed)
 Addended by: Felita Bump G on: 02/29/2024 02:41 PM   Modules accepted: Orders

## 2024-04-21 ENCOUNTER — Ambulatory Visit: Admitting: Internal Medicine

## 2024-04-23 ENCOUNTER — Telehealth: Payer: Self-pay

## 2024-04-23 NOTE — Telephone Encounter (Signed)
 Copied from CRM #[id number]. Topic: Referral - Request for Referral >> Apr 23, 2024 11:38 AM China J wrote: Did the patient discuss referral with their provider in the last year? No (If No - schedule appointment) (If Yes - send message)  Appointment offered? No  Type of order/referral and detailed reason for visit: The patient was struggling with kidney stones 10 years ago and would like to see a urologist because he does not know if he is dealing with the same issue again. The patient does not want to set up an initial appointment with Dr. Jesus and believes it would be a wast of time.  Preference of office, provider, location: Any urology clinic within [facility].  If referral order, have you been seen by this specialty before? No (If Yes, this issue or another issue? When? Where?  Can we respond through MyChart? Yes  Please review and advise if can send over referral or not thanks

## 2024-04-27 ENCOUNTER — Telehealth: Payer: Self-pay | Admitting: Internal Medicine

## 2024-04-27 NOTE — Telephone Encounter (Signed)
 Patient called and left a voicemail requesting a new patient appointment. He did not leave any other information. I called back and left him a voicemail to call the office back. Rosaline

## 2024-04-30 ENCOUNTER — Encounter: Payer: Self-pay | Admitting: Internal Medicine

## 2024-04-30 ENCOUNTER — Ambulatory Visit: Payer: Self-pay | Admitting: Internal Medicine

## 2024-04-30 ENCOUNTER — Ambulatory Visit: Admitting: Internal Medicine

## 2024-04-30 ENCOUNTER — Ambulatory Visit

## 2024-04-30 VITALS — BP 130/76 | HR 78 | Temp 98.0°F | Ht 71.0 in | Wt 220.2 lb

## 2024-04-30 DIAGNOSIS — Z87442 Personal history of urinary calculi: Secondary | ICD-10-CM

## 2024-04-30 DIAGNOSIS — R10A1 Flank pain, right side: Secondary | ICD-10-CM

## 2024-04-30 DIAGNOSIS — R1031 Right lower quadrant pain: Secondary | ICD-10-CM

## 2024-04-30 LAB — CBC WITH DIFFERENTIAL/PLATELET
Basophils Absolute: 0.1 K/uL (ref 0.0–0.1)
Basophils Relative: 0.8 % (ref 0.0–3.0)
Eosinophils Absolute: 0.1 K/uL (ref 0.0–0.7)
Eosinophils Relative: 1.7 % (ref 0.0–5.0)
HCT: 42.1 % (ref 39.0–52.0)
Hemoglobin: 14.7 g/dL (ref 13.0–17.0)
Lymphocytes Relative: 22.7 % (ref 12.0–46.0)
Lymphs Abs: 1.5 K/uL (ref 0.7–4.0)
MCHC: 35 g/dL (ref 30.0–36.0)
MCV: 89.6 fl (ref 78.0–100.0)
Monocytes Absolute: 0.6 K/uL (ref 0.1–1.0)
Monocytes Relative: 9.1 % (ref 3.0–12.0)
Neutro Abs: 4.2 K/uL (ref 1.4–7.7)
Neutrophils Relative %: 65.7 % (ref 43.0–77.0)
Platelets: 153 K/uL (ref 150.0–400.0)
RBC: 4.69 Mil/uL (ref 4.22–5.81)
RDW: 13 % (ref 11.5–15.5)
WBC: 6.4 K/uL (ref 4.0–10.5)

## 2024-04-30 LAB — COMPREHENSIVE METABOLIC PANEL WITH GFR
ALT: 36 U/L (ref 0–53)
AST: 25 U/L (ref 0–37)
Albumin: 4 g/dL (ref 3.5–5.2)
Alkaline Phosphatase: 65 U/L (ref 39–117)
BUN: 17 mg/dL (ref 6–23)
CO2: 26 meq/L (ref 19–32)
Calcium: 9.2 mg/dL (ref 8.4–10.5)
Chloride: 103 meq/L (ref 96–112)
Creatinine, Ser: 1.02 mg/dL (ref 0.40–1.50)
GFR: 75.15 mL/min (ref >60.00)
Glucose, Bld: 242 mg/dL — ABNORMAL HIGH (ref 70–99)
Potassium: 4.1 meq/L (ref 3.5–5.1)
Sodium: 139 meq/L (ref 135–145)
Total Bilirubin: 0.7 mg/dL (ref 0.2–1.2)
Total Protein: 6.7 g/dL (ref 6.0–8.3)

## 2024-04-30 MED ORDER — ACETAMINOPHEN 500 MG PO TABS: 500.0000 mg | ORAL_TABLET | Freq: Four times a day (QID) | ORAL | 0 refills | Status: AC | PRN

## 2024-04-30 MED ORDER — TAMSULOSIN HCL 0.4 MG PO CAPS: 0.4000 mg | ORAL_CAPSULE | Freq: Every day | ORAL | 3 refills | Status: AC

## 2024-04-30 MED ORDER — TRAMADOL HCL 50 MG PO TABS: 50.0000 mg | ORAL_TABLET | Freq: Three times a day (TID) | ORAL | 0 refills | Status: AC | PRN

## 2024-04-30 NOTE — Patient Instructions (Signed)
 It was a pleasure seeing you today! Your health and satisfaction are our top priorities.  Bernardino Cone, MD  VISIT SUMMARY: Today, you were seen for right flank pain, which you have been experiencing for about a week. Given your history of kidney stones, the pain is likely due to nephrolithiasis (kidney stones). We discussed your symptoms, ordered some tests, and provided a treatment plan to help manage your pain and facilitate the passage of the stone.  YOUR PLAN: -RIGHT FLANK PAIN DUE TO SUSPECTED NEPHROLITHIASIS: Nephrolithiasis means kidney stones, which are hard deposits made of minerals and salts that form inside your kidneys. We suspect your right flank pain is due to kidney stones. We have ordered a urinalysis to check for blood in your urine, a KUB x-ray to visualize the stones, and blood work to assess your kidney function. You have been prescribed Flomax to help pass the stone and tramadol for pain management, with the option to use prescription strength Tylenol as needed. Please increase your fluid intake to 3-4 liters per day and continue taking vitamin C. A referral to urology has been made, but you can cancel it if the stone passes or if the pain becomes severe.  -HISTORY OF NEPHROLITHIASIS: You have a history of kidney stones, and your current symptoms suggest a recurrence. We discussed your previous experience with stent placement and its discomfort. It is important to stay hydrated and take vitamin C to prevent future stones.  -GENERAL HEALTH MAINTENANCE: We discussed the importance of flu and COVID vaccinations, especially for your age group. Although you are skeptical about the flu shot, it is recommended for those over 65 to help protect against severe illness.  INSTRUCTIONS: Please follow up with the urinalysis, KUB x-ray, and blood work as ordered. Increase your fluid intake to 3-4 liters per day and take the prescribed medications as directed. If the stone passes or if the pain  becomes severe, you may cancel the urology referral. Consider getting the flu and COVID vaccinations as recommended.  Your Providers PCP: Cone Bernardino MATSU, MD,  [phone]) Referring Provider: Cone Bernardino MATSU, MD,  [phone]) Care Team Provider: Luke Orlan HERO, DO,  [phone])  NEXT STEPS: [x]  Early Intervention: Schedule sooner appointment, call our on-call services, or go to emergency room if there is any significant Increase in pain or discomfort New or worsening symptoms Sudden or severe changes in your health [x]  Flexible Follow-Up: We recommend a No follow-ups on file. for optimal routine care. This allows for progress monitoring and treatment adjustments. [x]  Preventive Care: Schedule your annual preventive care visit! It's typically covered by insurance and helps identify potential health issues early. [x]  Lab & X-ray Appointments: Incomplete tests scheduled today, or call to schedule. X-rays: [facility] (M-F, 8:30am-noon or 1pm-5pm). [x]  Medical Information Release: Sign a release form at front desk to obtain relevant medical information we don't have.  MAKING THE MOST OF OUR FOCUSED 20 MINUTE APPOINTMENTS: [x]   Clearly state your top concerns at the beginning of the visit to focus our discussion [x]   If you anticipate you will need more time, please inform the front desk during scheduling - we can book multiple appointments in the same week. [x]   If you have transportation problems- use our convenient video appointments or ask about transportation support. [x]   We can get down to business faster if you use MyChart to update information before the visit and submit non-urgent questions before your visit. Thank you for taking the time to  provide details through MyChart.  Let our nurse know and she can import this information into your encounter documents.  Arrival and Wait Times: [x]   Arriving on time ensures that everyone receives prompt attention. [x]    Early morning (8a) and afternoon (1p) appointments tend to have shortest wait times. [x]   Unfortunately, we cannot delay appointments for late arrivals or hold slots during phone calls.  Getting Answers and Following Up [x]   Simple Questions & Concerns: For quick questions or basic follow-up after your visit, reach us  at [phone] or MyChart messaging. [x]   Complex Concerns: If your concern is more complex, scheduling an appointment might be best. Discuss this with the staff to find the most suitable option. [x]   Lab & Imaging Results: We'll contact you directly if results are abnormal or you don't use MyChart. Most normal results will be on MyChart within 2-3 business days, with a review message from Dr. Jesus. Haven't heard back in 2 weeks? Need results sooner? Contact us  at [phone]. [x]   Referrals: Our referral coordinator will manage specialist referrals. The specialist's office should contact you within 2 weeks to schedule an appointment. Call us  if you haven't heard from them after 2 weeks.  Staying Connected [x]   MyChart: Activate your MyChart for the fastest way to access results and message us . See the last page of this paperwork for instructions on how to activate.  Bring to Your Next Appointment [x]   Medications: Please bring all your medication bottles to your next appointment to ensure we have an accurate record of your prescriptions. [x]   Health Diaries: If you're monitoring any health conditions at home, keeping a diary of your readings can be very helpful for discussions at your next appointment.  Billing [x]   X-ray & Lab Orders: These are billed by separate companies. Contact the invoicing company directly for questions or concerns. [x]   Visit Charges: Discuss any billing inquiries with our administrative services team.  Your Satisfaction Matters [x]   Share Your Experience: We strive for your satisfaction! If you have any complaints, or preferably compliments,  please let Dr. Jesus know directly or contact our Practice Administrators, Manuelita Rubin or [employer], by asking at the front desk.   Reviewing Your Records [x]   Review this early draft of your clinical encounter notes below and the final encounter summary tomorrow on MyChart after its been completed.  All orders placed so far are visible here: Flank pain, right side -     Tamsulosin HCl; Take 1 capsule (0.4 mg total) by mouth daily.  Dispense: 30 capsule; Refill: 3 -     Urinalysis w microscopic + reflex cultur -     DG Abd 1 View; Future -     Ambulatory referral to Urology -     CBC with Differential/Platelet -     Comprehensive metabolic panel with GFR -     traMADol HCl; Take 1 tablet (50 mg total) by mouth every 8 (eight) hours as needed for up to 5 days.  Dispense: 15 each; Refill: 0 -     Acetaminophen; Take 1 tablet (500 mg total) by mouth every 6 (six) hours as needed.  Dispense: 30 tablet; Refill: 0  Personal history of kidney stones -     Tamsulosin HCl; Take 1 capsule (0.4 mg total) by mouth daily.  Dispense: 30 capsule; Refill: 3 -     Urinalysis w microscopic + reflex cultur -     DG Abd 1 View; Future -  Ambulatory referral to Urology -     CBC with Differential/Platelet -     Comprehensive metabolic panel with GFR -     traMADol HCl; Take 1 tablet (50 mg total) by mouth every 8 (eight) hours as needed for up to 5 days.  Dispense: 15 each; Refill: 0 -     Acetaminophen; Take 1 tablet (500 mg total) by mouth every 6 (six) hours as needed.  Dispense: 30 tablet; Refill: 0

## 2024-04-30 NOTE — Progress Notes (Signed)
 ==============================  [facility]: [phone]   -- Medical Office Visit --  Patient: Joseph Cummings      Age: 68 y.o.       Sex:  male  Date:   04/30/2024 Today's Healthcare Provider: Bernardino KANDICE Cone, MD  ==============================   Chief Complaint: Discuss urology referral  Discussed the use of AI scribe software for clinical note transcription with the patient, who gave verbal consent to proceed.  History of Present Illness  68 year old male with a history of kidney stones who presents with right flank pain.  He has been experiencing right flank pain for about a week, initially attributing it to a pulled muscle. The pain has since migrated lower. He describes the pain as ranging from 'uncomfortable' to 'hurt', but not reaching his highest level of pain, 'holy shnikes'. The pain had subsided a few days ago but has returned to an 'uncomfortable' level.  He has a history of kidney stones, having undergone surgery 12 years ago to break up a large stone. He has reduced iced tea consumption and started taking vitamin C within the last year to prevent future stones.  He is currently experiencing pain in the kidney area. He has not noticed any visible blood in his urine. No pus in the urine.  He is not currently taking any pain medication but recalls using prescription strength Tylenol in the past. He avoids narcotics due to their side effects and prefers to manage pain with non-narcotic options.    Background Reviewed: Problem List: has Pruritus; Rash and other nonspecific skin eruption; Hypertriglyceridemia; Type 2 diabetes mellitus with hyperglycemia (HCC); and Hyperlipidemia on their problem list. Past Medical History:  has a past medical history of Arthritis, Diabetes mellitus (HCC) (01/2018), Fracture of metacarpal bone (10/12/2021), History of kidney stones, and Hypertriglyceridemia (07/2016). Past Surgical History:   has a past  surgical history that includes Kidney stone surgery. Social History:   reports that he quit smoking about 32 years ago. His smoking use included cigarettes. He started smoking about 42 years ago. He has a 10 pack-year smoking history. He has never used smokeless tobacco. He reports current alcohol use. He reports that he does not use drugs. Family History:  family history includes Arthritis in his father; Heart disease in his father; Hyperlipidemia in his father and mother; Hypertension in his father and mother. Allergies:  is allergic to sulfa antibiotics and formaldehyde.   Medication Reconciliation: Current Outpatient Medications on File Prior to Visit  Medication Sig  . fexofenadine (ALLEGRA) 180 MG tablet Take 180 mg by mouth daily.  . magnesium 30 MG tablet Take 30 mg by mouth daily.   . metFORMIN  (GLUCOPHAGE ) 500 MG tablet Take 2 tablets (1,000 mg total) by mouth 2 (two) times daily with a meal.  . Continuous Glucose Sensor (FREESTYLE LIBRE 3 PLUS SENSOR) MISC Change sensor every 15 days. (Patient not taking: Reported on 04/30/2024)  . neomycin-polymyxin-hydrocortisone  (CORTISPORIN) OTIC solution Place 3 drops into both ears 2 (two) times daily.  . rosuvastatin  (CRESTOR ) 10 MG tablet Take 1 tablet (10 mg total) by mouth daily. (Patient not taking: Reported on 02/21/2024)  . triamcinolone  ointment (KENALOG ) 0.1 % Apply 1 application topically 2 (two) times daily. Below the neck up to 3 weeks at a time.  . Turmeric 1053 MG TABS Take 1 tablet by mouth daily at 6 (six) AM.  . Zinc  Citrate-Phytase 25-500 MG CAPS Take 1 capsule by mouth daily.  . [  DISCONTINUED] budesonide -formoterol  (SYMBICORT ) 160-4.5 MCG/ACT inhaler Inhale 2 puffs into the lungs 2 (two) times daily.   No current facility-administered medications on file prior to visit.  There are no discontinued medications.   Physical Exam:    04/30/2024    8:01 AM 02/21/2024   10:00 AM 10/31/2023    9:37 AM  Vitals with BMI  Height 5'  11 5' 11 5' 11  Weight 220 lbs 3 oz 215 lbs 6 oz 215 lbs 3 oz  BMI 30.73 30.06 30.03  Systolic 130 130 854  Diastolic 76 76 84  Pulse 78 80 71  Vital signs reviewed.  Nursing notes reviewed. Weight trend reviewed. Physical Activity: Insufficiently Active (10/30/2023)   Exercise Vital Sign   . Days of Exercise per Week: 2 days   . Minutes of Exercise per Session: 20 min   General Appearance:  No acute distress appreciable.   Well-groomed, healthy-appearing male.  Well proportioned with no abnormal fat distribution.  Good muscle tone. Pulmonary:  Normal work of breathing at rest, no respiratory distress apparent. SpO2: 98 %  Musculoskeletal: All extremities are intact.  Neurological:  Awake, alert, oriented, and engaged.  No obvious focal neurological deficits or cognitive impairments.  Sensorium seems unclouded.   Speech is clear and coherent with logical content. Psychiatric:  Appropriate mood, pleasant and cooperative demeanor, thoughtful and engaged during the exam   Verbalized to patient: Physical Exam No abdominal tenderness or CVA tenderness.  Physical Exam Musculoskeletal:       Arms:     Comments: Lower right flank pain without tenderness         02/21/2024   10:09 AM 10/18/2023    9:16 AM 01/17/2018    1:08 PM  PHQ 2/9 Scores  PHQ - 2 Score 0 0 0  PHQ- 9 Score  0  0      Data saved with a previous flowsheet row definition    {   No results found for any visits on 04/30/24.} Office Visit on 02/21/2024  Component Date Value Ref Range Status  . WBC 02/21/2024 4.6  4.0 - 10.5 K/uL Final  . RBC 02/21/2024 4.81  4.22 - 5.81 Mil/uL Final  . Hemoglobin 02/21/2024 15.0  13.0 - 17.0 g/dL Final  . HCT 90/94/7974 43.8  39.0 - 52.0 % Final  . MCV 02/21/2024 91.0  78.0 - 100.0 fl Final  . MCHC 02/21/2024 34.2  30.0 - 36.0 g/dL Final  . RDW 90/94/7974 13.7  11.5 - 15.5 % Final  . Platelets 02/21/2024 150.0  150.0 - 400.0 K/uL Final  . Neutrophils Relative % 02/21/2024  56.1  43.0 - 77.0 % Final  . Lymphocytes Relative 02/21/2024 30.5  12.0 - 46.0 % Final  . Monocytes Relative 02/21/2024 10.1  3.0 - 12.0 % Final  . Eosinophils Relative 02/21/2024 2.3  0.0 - 5.0 % Final  . Basophils Relative 02/21/2024 1.0  0.0 - 3.0 % Final  . Neutro Abs 02/21/2024 2.6  1.4 - 7.7 K/uL Final  . Lymphs Abs 02/21/2024 1.4  0.7 - 4.0 K/uL Final  . Monocytes Absolute 02/21/2024 0.5  0.1 - 1.0 K/uL Final  . Eosinophils Absolute 02/21/2024 0.1  0.0 - 0.7 K/uL Final  . Basophils Absolute 02/21/2024 0.0  0.0 - 0.1 K/uL Final  . Sodium 02/21/2024 138  135 - 145 mEq/L Final  . Potassium 02/21/2024 4.2  3.5 - 5.1 mEq/L Final  . Chloride 02/21/2024 102  96 - 112 mEq/L Final  .  CO2 02/21/2024 26  19 - 32 mEq/L Final  . Glucose, Bld 02/21/2024 192 (H)  70 - 99 mg/dL Final  . BUN 90/94/7974 16  6 - 23 mg/dL Final  . Creatinine, Ser 02/21/2024 0.95  0.40 - 1.50 mg/dL Final  . Total Bilirubin 02/21/2024 0.7  0.2 - 1.2 mg/dL Final  . Alkaline Phosphatase 02/21/2024 82  39 - 117 U/L Final  . AST 02/21/2024 27  0 - 37 U/L Final  . ALT 02/21/2024 33  0 - 53 U/L Final  . Total Protein 02/21/2024 7.0  6.0 - 8.3 g/dL Final  . Albumin 90/94/7974 4.1  3.5 - 5.2 g/dL Final  . GFR 90/94/7974 81.96  >60.00 mL/min Final  . Calcium  02/21/2024 9.0  8.4 - 10.5 mg/dL Final  . Cholesterol 90/94/7974 201 (H)  0 - 200 mg/dL Final  . Triglycerides 02/21/2024 398.0 (H)  0.0 - 149.0 mg/dL Final  . HDL 90/94/7974 47.00  >39.00 mg/dL Final  . VLDL 90/94/7974 79.6 (H)  0.0 - 40.0 mg/dL Final  . LDL Cholesterol 02/21/2024 75  0 - 99 mg/dL Final  . Total CHOL/HDL Ratio 02/21/2024 4   Final  . NonHDL 02/21/2024 154.41   Final  . Hgb A1c MFr Bld 02/21/2024 8.2 (H)  4.6 - 6.5 % Final  . Microalb, Ur 02/21/2024 0.9  0.0 - 1.9 mg/dL Final  . Creatinine,U 90/94/7974 139.5  mg/dL Final  . Microalb Creat Ratio 02/21/2024 6.4  0.0 - 30.0 mg/g Final  Scanned Document on 10/30/2023  Component Date Value Ref Range  Status  . A1c 02/20/2023 7.1   Final  . EGFR 02/20/2023 88.0   Final  . EGFR 10/31/2022 87.0   Final  . Microalb Creat Ratio 10/31/2022 11.0   Final  . Creatinine, POC 10/31/2022 64  mg/dL Final  . Microalbumin, Urine 10/31/2022 0.7   Final  . Cologuard 11/01/2021 Negative  Negative Final  . A1c 10/31/2022 7.2   Final  Office Visit on 10/18/2023  Component Date Value Ref Range Status  . WBC 10/18/2023 5.4  4.0 - 10.5 K/uL Final  . RBC 10/18/2023 4.80  4.22 - 5.81 Mil/uL Final  . Hemoglobin 10/18/2023 15.0  13.0 - 17.0 g/dL Final  . HCT 94/97/7974 43.9  39.0 - 52.0 % Final  . MCV 10/18/2023 91.4  78.0 - 100.0 fl Final  . MCHC 10/18/2023 34.1  30.0 - 36.0 g/dL Final  . RDW 94/97/7974 14.0  11.5 - 15.5 % Final  . Platelets 10/18/2023 165.0  150.0 - 400.0 K/uL Final  . Neutrophils Relative % 10/18/2023 55.0  43.0 - 77.0 % Final  . Lymphocytes Relative 10/18/2023 29.5  12.0 - 46.0 % Final  . Monocytes Relative 10/18/2023 11.5  3.0 - 12.0 % Final  . Eosinophils Relative 10/18/2023 3.0  0.0 - 5.0 % Final  . Basophils Relative 10/18/2023 1.0  0.0 - 3.0 % Final  . Neutro Abs 10/18/2023 3.0  1.4 - 7.7 K/uL Final  . Lymphs Abs 10/18/2023 1.6  0.7 - 4.0 K/uL Final  . Monocytes Absolute 10/18/2023 0.6  0.1 - 1.0 K/uL Final  . Eosinophils Absolute 10/18/2023 0.2  0.0 - 0.7 K/uL Final  . Basophils Absolute 10/18/2023 0.1  0.0 - 0.1 K/uL Final  . Sodium 10/18/2023 139  135 - 145 mEq/L Final  . Potassium 10/18/2023 4.4  3.5 - 5.1 mEq/L Final  . Chloride 10/18/2023 102  96 - 112 mEq/L Final  . CO2 10/18/2023 24  19 -  32 mEq/L Final  . Glucose, Bld 10/18/2023 127 (H)  70 - 99 mg/dL Final  . BUN 94/97/7974 14  6 - 23 mg/dL Final  . Creatinine, Ser 10/18/2023 0.88  0.40 - 1.50 mg/dL Final  . Total Bilirubin 10/18/2023 0.7  0.2 - 1.2 mg/dL Final  . Alkaline Phosphatase 10/18/2023 72  39 - 117 U/L Final  . AST 10/18/2023 18  0 - 37 U/L Final  . ALT 10/18/2023 23  0 - 53 U/L Final  . Total Protein  10/18/2023 7.1  6.0 - 8.3 g/dL Final  . Albumin 94/97/7974 4.4  3.5 - 5.2 g/dL Final  . GFR 94/97/7974 88.26  >60.00 mL/min Final  . Calcium  10/18/2023 9.5  8.4 - 10.5 mg/dL Final  . Cholesterol 94/97/7974 184  0 - 200 mg/dL Final  . Triglycerides 10/18/2023 334.0 (H)  0.0 - 149.0 mg/dL Final  . HDL 94/97/7974 45.60  >39.00 mg/dL Final  . VLDL 94/97/7974 66.8 (H)  0.0 - 40.0 mg/dL Final  . LDL Cholesterol 10/18/2023 72  0 - 99 mg/dL Final  . Total CHOL/HDL Ratio 10/18/2023 4   Final  . NonHDL 10/18/2023 138.82   Final  . Hgb A1c MFr Bld 10/18/2023 8.0 (H)  4.6 - 6.5 % Final  . Microalb, Ur 10/18/2023 <0.7  mg/dL Final  . Creatinine,U 94/97/7974 60.2  mg/dL Final  . Microalb Creat Ratio 10/18/2023 Unable to calculate  0.0 - 30.0 mg/g Final         ASSESSMENT & PLAN   Assessment & Plan Flank pain, right side Right flank pain due to suspected nephrolithiasis   Intermittent right flank pain is likely due to nephrolithiasis, with symptoms of discomfort and pain that have decreased over the past few days. The location and history suggest nephrolithiasis, though muscle or nerve pain is considered. There are no signs of infection or pus in urine, and the pain does not require emergency intervention. Ordered urinalysis to check for hematuria, KUB x-ray to visualize kidney stones, and blood work to assess kidney function. Prescribed Flomax to facilitate stone passage and tramadol for pain management, with instructions to use prescription strength Tylenol as needed. Advised increased fluid intake, aiming for 3-4 liters per day, and recommended vitamin C supplementation. Placed urology referral, with instructions to cancel if the stone passes or if pain becomes severe. Personal history of kidney stones History of nephrolithiasis   He has a history of nephrolithiasis with previous surgical intervention for a large kidney stone. Current symptoms and history suggest recurrence, but the current stone may  pass spontaneously. Discussed previous experience with stent placement and its discomfort. Emphasized the importance of hydration and vitamin C to prevent future stones.  Discussed flu and COVID vaccinations. He is skeptical about flu shots, citing personal experience of contracting flu despite vaccination. Acknowledged the limited efficacy of flu vaccines but emphasized their importance for his age group. Recommended flu and COVID vaccinations, acknowledging personal skepticism but emphasizing benefits for those over 65.  ORDER ASSOCIATIONS  #   DIAGNOSIS / CONDITION ICD-10 ENCOUNTER ORDER     ICD-10-CM   1. Flank pain, right side  R10.A1 tamsulosin (FLOMAX) 0.4 MG CAPS capsule    Urinalysis w microscopic + reflex cultur    DG Abd 1 View    Ambulatory referral to Urology    CBC with Differential/Platelet    Comprehensive metabolic panel with GFR    traMADol (ULTRAM) 50 MG tablet    acetaminophen (TYLENOL) 500 MG tablet  Orders Placed in Encounter:   Lab Orders         Urinalysis w microscopic + reflex cultur         CBC with Differential/Platelet         Comprehensive metabolic panel with GFR     Imaging Orders         DG Abd 1 View     Referral Orders         Ambulatory referral to Urology     Meds ordered this encounter  Medications  . tamsulosin (FLOMAX) 0.4 MG CAPS capsule    Sig: Take 1 capsule (0.4 mg total) by mouth daily.    Dispense:  30 capsule    Refill:  3  . traMADol (ULTRAM) 50 MG tablet    Sig: Take 1 tablet (50 mg total) by mouth every 8 (eight) hours as needed for up to 5 days.    Dispense:  15 each    Refill:  0  . acetaminophen (TYLENOL) 500 MG tablet    Sig: Take 1 tablet (500 mg total) by mouth every 6 (six) hours as needed.    Dispense:  30 tablet    Refill:  0    Orders Placed This Encounter  Procedures  . DG Abd 1 View    Standing Status:   Future    Expiration Date:   10/28/2024    Reason for Exam (SYMPTOM  OR DIAGNOSIS REQUIRED):    right flank pain eval for kidney stone    Preferred imaging location?:   [facility]  . Urinalysis w microscopic + reflex cultur  . CBC with Differential/Platelet  . Comprehensive metabolic panel with GFR  . Ambulatory referral to Urology    Referral Priority:   Routine    Referral Type:   Consultation    Referral Reason:   Specialty Services Required    Requested Specialty:   Urology    Number of Visits Requested:   1        This document was synthesized by artificial intelligence ([employer]) using HIPAA-compliant recording of the clinical interaction;   We discussed the use of AI scribe software for clinical note transcription with the patient, who gave verbal consent to proceed. additional Info: This encounter employed state-of-the-art, real-time, collaborative documentation. The patient actively reviewed and assisted in updating their electronic medical record on a shared screen, ensuring transparency and facilitating joint problem-solving for the problem list, overview, and plan. This approach promotes accurate, informed care. The treatment plan was discussed and reviewed in detail, including medication safety, potential side effects, and all patient questions. We confirmed understanding and comfort with the plan. Follow-up instructions were established, including contacting the office for any concerns, returning if symptoms worsen, persist, or new symptoms develop, and precautions for potential emergency department visits.

## 2024-04-30 NOTE — Addendum Note (Signed)
 Addended by: Phoenyx Melka Y on: 04/30/2024 11:02 AM   Modules accepted: Orders

## 2024-05-01 LAB — URINALYSIS W MICROSCOPIC + REFLEX CULTURE
Bilirubin Urine: NEGATIVE
Hgb urine dipstick: NEGATIVE
Leukocyte Esterase: NEGATIVE
Nitrites, Initial: NEGATIVE
Protein, ur: NEGATIVE
Specific Gravity, Urine: 1.026 (ref 1.001–1.035)
pH: 6 (ref 5.0–8.0)

## 2024-05-01 LAB — NO CULTURE INDICATED

## 2024-05-01 NOTE — Progress Notes (Signed)
 Focal 4 mm calcification in the right mid-abdomen, possibly representing a mid-pole renal stone.  Results strongly support a small stone that should be able to pass without special urology procedures because its only 4 mm.  It will be painful but shouldn't put your kidney at risk.  Drink lots of fluid and let me know if it doesn't pass soon.

## 2024-05-05 ENCOUNTER — Ambulatory Visit
Admission: RE | Admit: 2024-05-05 | Discharge: 2024-05-05 | Disposition: A | Discharge status: Home / Self Care | Source: Ambulatory Visit | Attending: Internal Medicine | Admitting: Internal Medicine

## 2024-05-05 ENCOUNTER — Ambulatory Visit: Payer: Self-pay | Admitting: Internal Medicine

## 2024-05-05 DIAGNOSIS — R1031 Right lower quadrant pain: Secondary | ICD-10-CM

## 2024-05-22 ENCOUNTER — Encounter: Payer: Self-pay | Admitting: Internal Medicine

## 2024-05-22 ENCOUNTER — Ambulatory Visit: Admitting: Internal Medicine

## 2024-05-22 VITALS — BP 118/70 | HR 88 | Temp 98.0°F | Ht 71.0 in | Wt 216.3 lb

## 2024-05-22 DIAGNOSIS — N200 Calculus of kidney: Secondary | ICD-10-CM

## 2024-05-22 DIAGNOSIS — E785 Hyperlipidemia, unspecified: Secondary | ICD-10-CM

## 2024-05-22 DIAGNOSIS — E1169 Type 2 diabetes mellitus with other specified complication: Secondary | ICD-10-CM

## 2024-05-22 DIAGNOSIS — E1165 Type 2 diabetes mellitus with hyperglycemia: Secondary | ICD-10-CM

## 2024-05-22 LAB — CBC WITH DIFFERENTIAL/PLATELET
Basophils Absolute: 0 K/uL (ref 0.0–0.1)
Basophils Relative: 0.7 % (ref 0.0–3.0)
Eosinophils Absolute: 0.1 K/uL (ref 0.0–0.7)
Eosinophils Relative: 2.1 % (ref 0.0–5.0)
HCT: 42.4 % (ref 39.0–52.0)
Hemoglobin: 14.6 g/dL (ref 13.0–17.0)
Lymphocytes Relative: 27.6 % (ref 12.0–46.0)
Lymphs Abs: 1.3 K/uL (ref 0.7–4.0)
MCHC: 34.6 g/dL (ref 30.0–36.0)
MCV: 88.9 fl (ref 78.0–100.0)
Monocytes Absolute: 0.5 K/uL (ref 0.1–1.0)
Monocytes Relative: 10.6 % (ref 3.0–12.0)
Neutro Abs: 2.8 K/uL (ref 1.4–7.7)
Neutrophils Relative %: 59 % (ref 43.0–77.0)
Platelets: 153 K/uL (ref 150.0–400.0)
RBC: 4.77 Mil/uL (ref 4.22–5.81)
RDW: 13.5 % (ref 11.5–15.5)
WBC: 4.7 K/uL (ref 4.0–10.5)

## 2024-05-22 LAB — COMPREHENSIVE METABOLIC PANEL WITH GFR
ALT: 29 U/L (ref 0–53)
AST: 23 U/L (ref 0–37)
Albumin: 4.2 g/dL (ref 3.5–5.2)
Alkaline Phosphatase: 62 U/L (ref 39–117)
BUN: 17 mg/dL (ref 6–23)
CO2: 26 meq/L (ref 19–32)
Calcium: 9.1 mg/dL (ref 8.4–10.5)
Chloride: 101 meq/L (ref 96–112)
Creatinine, Ser: 0.82 mg/dL (ref 0.40–1.50)
GFR: 89.78 mL/min (ref >60.00)
Glucose, Bld: 222 mg/dL — ABNORMAL HIGH (ref 70–99)
Potassium: 4 meq/L (ref 3.5–5.1)
Sodium: 136 meq/L (ref 135–145)
Total Bilirubin: 0.9 mg/dL (ref 0.2–1.2)
Total Protein: 6.7 g/dL (ref 6.0–8.3)

## 2024-05-22 LAB — HEMOGLOBIN A1C: Hgb A1c MFr Bld: 8.6 % — ABNORMAL HIGH (ref 4.6–6.5)

## 2024-05-22 LAB — LIPID PANEL
Cholesterol: 127 mg/dL (ref 0–200)
HDL: 49.2 mg/dL (ref >39.00)
LDL Cholesterol: 26 mg/dL (ref 0–99)
NonHDL: 78.27
Total CHOL/HDL Ratio: 3
Triglycerides: 261 mg/dL — ABNORMAL HIGH (ref 0.0–149.0)
VLDL: 52.2 mg/dL — ABNORMAL HIGH (ref 0.0–40.0)

## 2024-05-22 LAB — MICROALBUMIN / CREATININE URINE RATIO
Creatinine,U: 136.9 mg/dL
Microalb Creat Ratio: 6 mg/g (ref 0.0–30.0)
Microalb, Ur: 0.8 mg/dL (ref 0.0–1.9)

## 2024-05-22 MED ORDER — ROSUVASTATIN CALCIUM 20 MG PO TABS: 20.0000 mg | ORAL_TABLET | Freq: Every day | ORAL | 3 refills | Status: AC

## 2024-05-22 MED ORDER — METFORMIN HCL ER 500 MG PO TB24: 500.0000 mg | ORAL_TABLET | Freq: Every day | ORAL | 4 refills | Status: AC

## 2024-05-22 NOTE — Patient Instructions (Addendum)
 The expert Abby Moeller [email or web address].pena.[email or web address]/   The product Unicity https://[email or web address].unicity.com/usa /en/product/unimate?sku=35755  It was a pleasure seeing you today! Your health and satisfaction are our top priorities.  Bernardino Cone, MD  VISIT SUMMARY: You came in today for a follow-up visit to check on your diabetes management and cholesterol levels. We discussed your current medication regimen, dietary habits, and some new options to help manage your conditions more effectively.  YOUR PLAN: -TYPE 2 DIABETES MELLITUS: Type 2 diabetes is a condition where your body does not use insulin properly, leading to high blood sugar levels. Your A1c level is currently 8.2, which indicates that your blood sugar is not well-controlled. We have switched you to metformin  500 mg extended-release, two tablets each morning, to help with adherence. We also discussed dietary changes and a natural fiber product called Unicity that may help lower your A1c levels. An A1c test has been ordered to assess your current diabetes control.  -HYPERLIPIDEMIA: Hyperlipidemia means you have high levels of cholesterol in your blood, which can increase your risk of heart disease. You have been managing this with rosuvastatin , but you recently ran out. We have increased your rosuvastatin  dose from 10 mg to 20 mg to better manage your cholesterol levels. A cholesterol test has been ordered to check your current levels.  -KIDNEY STONE: A kidney stone is a hard deposit made of minerals and salts that forms in your kidneys. You had a 4 mm kidney stone identified previously but are currently not experiencing any discomfort. You will continue taking Flomax  to help pass the stone. A urinalysis has been ordered to check if the stone has passed, and a microalbumin test will assess your kidney function.  INSTRUCTIONS: Please  follow up with the ordered A1c test, cholesterol test, urinalysis, and microalbumin test. Continue taking your medications as prescribed and make the discussed dietary changes. If you experience any new symptoms or have concerns, please contact our office.  Your Providers PCP: Cone Bernardino MATSU, MD,  [phone]) Referring Provider: Cone Bernardino MATSU, MD,  [phone]) Care Team Provider: Luke Orlan HERO, DO,  [phone])  NEXT STEPS: [x]  Early Intervention: Schedule sooner appointment, call our on-call services, or go to emergency room if there is any significant Increase in pain or discomfort New or worsening symptoms Sudden or severe changes in your health [x]  Flexible Follow-Up: We recommend a No follow-ups on file. for optimal routine care. This allows for progress monitoring and treatment adjustments. [x]  Preventive Care: Schedule your annual preventive care visit! It's typically covered by insurance and helps identify potential health issues early. [x]  Lab & X-ray Appointments: Incomplete tests scheduled today, or call to schedule. X-rays: [facility] (M-F, 8:30am-noon or 1pm-5pm). [x]  Medical Information Release: Sign a release form at front desk to obtain relevant medical information we don't have.  MAKING THE MOST OF OUR FOCUSED 20 MINUTE APPOINTMENTS: [x]   Clearly state your top concerns at the beginning of the visit to focus our discussion [x]   If you anticipate you will need more time, please inform the front desk during scheduling - we can book multiple appointments in the same week. [x]   If you have transportation problems- use our convenient video appointments or ask about transportation support. [x]   We can get down to business faster if you use MyChart to update information before the visit and submit non-urgent questions before your visit. Thank you for taking the time to provide details through MyChart.  Let our nurse know and she can import this  information  into your encounter documents.  Arrival and Wait Times: [x]   Arriving on time ensures that everyone receives prompt attention. [x]   Early morning (8a) and afternoon (1p) appointments tend to have shortest wait times. [x]   Unfortunately, we cannot delay appointments for late arrivals or hold slots during phone calls.  Getting Answers and Following Up [x]   Simple Questions & Concerns: For quick questions or basic follow-up after your visit, reach us  at [phone] or MyChart messaging. [x]   Complex Concerns: If your concern is more complex, scheduling an appointment might be best. Discuss this with the staff to find the most suitable option. [x]   Lab & Imaging Results: We'll contact you directly if results are abnormal or you don't use MyChart. Most normal results will be on MyChart within 2-3 business days, with a review message from Dr. Jesus. Haven't heard back in 2 weeks? Need results sooner? Contact us  at [phone]. [x]   Referrals: Our referral coordinator will manage specialist referrals. The specialist's office should contact you within 2 weeks to schedule an appointment. Call us  if you haven't heard from them after 2 weeks.  Staying Connected [x]   MyChart: Activate your MyChart for the fastest way to access results and message us . See the last page of this paperwork for instructions on how to activate.  Bring to Your Next Appointment [x]   Medications: Please bring all your medication bottles to your next appointment to ensure we have an accurate record of your prescriptions. [x]   Health Diaries: If you're monitoring any health conditions at home, keeping a diary of your readings can be very helpful for discussions at your next appointment.  Billing [x]   X-ray & Lab Orders: These are billed by separate companies. Contact the invoicing company directly for questions or concerns. [x]   Visit Charges: Discuss any billing inquiries with our administrative services team.  Your  Satisfaction Matters [x]   Share Your Experience: We strive for your satisfaction! If you have any complaints, or preferably compliments, please let Dr. Jesus know directly or contact our Practice Administrators, Manuelita Rubin or [employer], by asking at the front desk.   Reviewing Your Records [x]   Review this early draft of your clinical encounter notes below and the final encounter summary tomorrow on MyChart after its been completed.  All orders placed so far are visible here: Type 2 diabetes mellitus with hyperglycemia, without long-term current use of insulin (HCC) -     metFORMIN  HCl ER; Take 1 tablet (500 mg total) by mouth daily with breakfast.  Dispense: 180 tablet; Refill: 4 -     Rosuvastatin  Calcium ; Take 1 tablet (20 mg total) by mouth daily.  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

## 2024-05-22 NOTE — Assessment & Plan Note (Signed)
 Type 2 diabetes mellitus   His A1c is 8.2, indicating suboptimal control. He takes metformin  500 mg twice daily but struggles with adherence due to meal timing. He avoids sugary drinks and is conscious of sugar intake. We discussed the benefits of extended-release metformin  for easier adherence and alternative diabetes management approaches, including dietary changes and Unicity, a natural fiber product that may reduce A1c levels without medication. He is open to these options. Switched to metformin  500 mg extended-release, two tablets each morning. Ordered an A1c test to assess current diabetes control. Provided information on Unicity and dietary modifications.

## 2024-05-22 NOTE — Progress Notes (Signed)
 ==============================  [facility]: [phone]   -- Medical Office Visit --  Patient: Joseph Cummings      Age: 68 y.o.       Sex:  male  Date:   05/22/2024 Today's Healthcare Provider: Bernardino KANDICE Cone, MD  ==============================   Chief Complaint: Diabetes  Discussed the use of AI scribe software for clinical note transcription with the patient, who gave verbal consent to proceed.  History of Present Illness 68 year old male with type 2 diabetes who presents for a three-month follow-up to evaluate diabetes management and cholesterol levels.  He has not checked his blood sugars recently but has made changes to his diet and medication regimen. He is taking metformin  500 mg almost every day, with difficulty adhering to the twice-daily schedule due to meal timing, often taking the second dose when he gets home. He has been careful with sugar intake, avoiding soda and opting for unsweetened tea or lemonade occasionally, and does not add sugar to coffee. His A1c was previously recorded at 7.1 in 2024 and 8.2 more recently. He does not currently use a sugar monitor but has access to one through his wife, who has juvenile-onset diabetes. He prefers to test his blood sugar when he feels he has been less disciplined with his diet.  He ran out of rosuvastatin  a few days ago and has not experienced any side effects. He has a family history of diabetes, with both parents developing type 2 diabetes in their seventies.  He mentions a history of a kidney stone, identified as a 4 mm stone on a CT scan, but currently has no discomfort and is unsure if it has passed. He continues to take Flomax  to aid in passing the stone. No muscle cramps reported with rosuvastatin  use.  Background Reviewed: Problem List: has Pruritus; Rash and other nonspecific skin eruption; Hypertriglyceridemia; Type 2 diabetes mellitus with hyperglycemia (HCC); and Hyperlipidemia  on their problem list. Past Medical History:  has a past medical history of Arthritis, Diabetes mellitus (HCC) (01/2018), Fracture of metacarpal bone (10/12/2021), History of kidney stones, and Hypertriglyceridemia (07/2016). Past Surgical History:   has a past surgical history that includes Kidney stone surgery. Social History:   reports that he quit smoking about 32 years ago. His smoking use included cigarettes. He started smoking about 42 years ago. He has a 10 pack-year smoking history. He has never used smokeless tobacco. He reports current alcohol use. He reports that he does not use drugs. Family History:  family history includes Arthritis in his father; Heart disease in his father; Hyperlipidemia in his father and mother; Hypertension in his father and mother. Allergies:  is allergic to sulfa antibiotics and formaldehyde.   Medication Reconciliation: Current Outpatient Medications on File Prior to Visit  Medication Sig   acetaminophen  (TYLENOL ) 500 MG tablet Take 1 tablet (500 mg total) by mouth every 6 (six) hours as needed.   Ascorbic Acid (VITAMIN C) 1000 MG tablet Take 1,000 mg by mouth daily.   B Complex Vitamins (VITAMIN B COMPLEX) CAPS as directed Orally   cholecalciferol (VITAMIN D3) 25 MCG (1000 UNIT) tablet Take 1,000 Units by mouth daily.   Cholecalciferol 50 MCG (2000 UT) CAPS 1 capsule.   Docosahexaenoic Acid (DHA ALGAL-900 PO) Take by mouth.   fexofenadine (ALLEGRA) 180 MG tablet Take 180 mg by mouth daily.   Garlic 500 MG TABS Take by mouth.   Ginger, Zingiber officinalis, (GINGER PO) Take 300 mg  by mouth.   Guggulipid-Black Pepper (GUGLIPID/BIOPERINE PO) Take by mouth.   magnesium 30 MG tablet Take 30 mg by mouth daily.    tamsulosin  (FLOMAX ) 0.4 MG CAPS capsule Take 1 capsule (0.4 mg total) by mouth daily.   Turmeric 1053 MG TABS Take 1 tablet by mouth daily at 6 (six) AM.   vitamin E  180 MG (400 UNITS) capsule 1 capsule.   Zinc  Citrate-Phytase 25-500 MG CAPS Take 1  capsule by mouth daily.   Continuous Glucose Sensor (FREESTYLE LIBRE 3 PLUS SENSOR) MISC Change sensor every 15 days. (Patient not taking: Reported on 04/30/2024)   neomycin-polymyxin-hydrocortisone  (CORTISPORIN) OTIC solution Place 3 drops into both ears 2 (two) times daily. (Patient not taking: Reported on 05/22/2024)   Omega-3 Fatty Acids  (EPA PO) Take by mouth.   Omega-3 Fatty Acids  (OMEGA 3 FISH OIL  PO) Take by mouth. (Patient taking differently: Take by mouth. 2,290mg  daily)   rosuvastatin  (CRESTOR ) 10 MG tablet Take 1 tablet (10 mg total) by mouth daily. (Patient not taking: Reported on 02/21/2024)   triamcinolone  ointment (KENALOG ) 0.1 % Apply 1 application topically 2 (two) times daily. Below the neck up to 3 weeks at a time.   [DISCONTINUED] budesonide -formoterol  (SYMBICORT ) 160-4.5 MCG/ACT inhaler Inhale 2 puffs into the lungs 2 (two) times daily.   No current facility-administered medications on file prior to visit.   Medications Discontinued During This Encounter  Medication Reason   metFORMIN  (GLUCOPHAGE ) 500 MG tablet     Physical Exam:    05/22/2024    9:20 AM 04/30/2024    8:01 AM 02/21/2024   10:00 AM  Vitals with BMI  Height 5' 11 5' 11 5' 11  Weight 216 lbs 5 oz 220 lbs 3 oz 215 lbs 6 oz  BMI 30.18 30.73 30.06  Systolic 118 130 869  Diastolic 70 76 76  Pulse 88 78 80  Vital signs reviewed.  Nursing notes reviewed. Weight trend reviewed. Physical Activity: Insufficiently Active (10/30/2023)   Exercise Vital Sign    Days of Exercise per Week: 2 days    Minutes of Exercise per Session: 20 min   General Appearance:  No acute distress appreciable.   Well-groomed, healthy-appearing male.  Well proportioned with no abnormal fat distribution.  Good muscle tone. Pulmonary:  Normal work of breathing at rest, no respiratory distress apparent. SpO2: 98 %  Musculoskeletal: All extremities are intact.  Neurological:  Awake, alert, oriented, and engaged.  No obvious focal  neurological deficits or cognitive impairments.  Sensorium seems unclouded.   Speech is clear and coherent with logical content. Psychiatric:  Appropriate mood, pleasant and cooperative demeanor, thoughtful and engaged during the exam     02/21/2024   10:09 AM 10/18/2023    9:16 AM 01/17/2018    1:08 PM  PHQ 2/9 Scores  PHQ - 2 Score 0 0 0  PHQ- 9 Score  0  0      Data saved with a previous flowsheet row definition   Lab Results  Component Value Date   HGBA1C 8.2 (H) 02/21/2024   HGBA1C 8.0 (H) 10/18/2023   HGBA1C 6.9 (H) 11/11/2020   HGBA1C 6.5 01/17/2018   HGBA1C 6.1 08/01/2016   Lab Results  Component Value Date   CHOL 201 (H) 02/21/2024   CHOL 184 10/18/2023   CHOL 183 11/11/2020   HDL 47.00 02/21/2024   HDL 45.60 10/18/2023   HDL 49.50 11/11/2020   LDLCALC 75 02/21/2024   LDLCALC 72 10/18/2023   LDLCALC 95 11/11/2020  TRIG 398.0 (H) 02/21/2024   TRIG 334.0 (H) 10/18/2023   TRIG 190.0 (H) 11/11/2020   CHOLHDL 4 02/21/2024   CHOLHDL 4 10/18/2023   CHOLHDL 4 11/11/2020   Office Visit on 04/30/2024  Component Date Value Ref Range Status   Color, Urine 04/30/2024 YELLOW  YELLOW Final   APPearance 04/30/2024 CLEAR  CLEAR Final   Specific Gravity, Urine 04/30/2024 1.026  1.001 - 1.035 Final   pH 04/30/2024 6.0  5.0 - 8.0 Final   Glucose, UA 04/30/2024 3+ (A)  NEGATIVE Final   Bilirubin Urine 04/30/2024 NEGATIVE  NEGATIVE Final   Ketones, ur 04/30/2024 TRACE (A)  NEGATIVE Final   Hgb urine dipstick 04/30/2024 NEGATIVE  NEGATIVE Final   Protein, ur 04/30/2024 NEGATIVE  NEGATIVE Final   Nitrites, Initial 04/30/2024 NEGATIVE  NEGATIVE Final   Leukocyte Esterase 04/30/2024 NEGATIVE  NEGATIVE Final   Note 04/30/2024    Final   WBC 04/30/2024 6.4  4.0 - 10.5 K/uL Final   RBC 04/30/2024 4.69  4.22 - 5.81 Mil/uL Final   Hemoglobin 04/30/2024 14.7  13.0 - 17.0 g/dL Final   HCT 88/86/7974 42.1  39.0 - 52.0 % Final   MCV 04/30/2024 89.6  78.0 - 100.0 fl Final   MCHC 04/30/2024  35.0  30.0 - 36.0 g/dL Final   RDW 88/86/7974 13.0  11.5 - 15.5 % Final   Platelets 04/30/2024 153.0  150.0 - 400.0 K/uL Final   Neutrophils Relative % 04/30/2024 65.7  43.0 - 77.0 % Final   Lymphocytes Relative 04/30/2024 22.7  12.0 - 46.0 % Final   Monocytes Relative 04/30/2024 9.1  3.0 - 12.0 % Final   Eosinophils Relative 04/30/2024 1.7  0.0 - 5.0 % Final   Basophils Relative 04/30/2024 0.8  0.0 - 3.0 % Final   Neutro Abs 04/30/2024 4.2  1.4 - 7.7 K/uL Final   Lymphs Abs 04/30/2024 1.5  0.7 - 4.0 K/uL Final   Monocytes Absolute 04/30/2024 0.6  0.1 - 1.0 K/uL Final   Eosinophils Absolute 04/30/2024 0.1  0.0 - 0.7 K/uL Final   Basophils Absolute 04/30/2024 0.1  0.0 - 0.1 K/uL Final   Sodium 04/30/2024 139  135 - 145 mEq/L Final   Potassium 04/30/2024 4.1  3.5 - 5.1 mEq/L Final   Chloride 04/30/2024 103  96 - 112 mEq/L Final   CO2 04/30/2024 26  19 - 32 mEq/L Final   Glucose, Bld 04/30/2024 242 (H)  70 - 99 mg/dL Final   BUN 88/86/7974 17  6 - 23 mg/dL Final   Creatinine, Ser 04/30/2024 1.02  0.40 - 1.50 mg/dL Final   Total Bilirubin 04/30/2024 0.7  0.2 - 1.2 mg/dL Final   Alkaline Phosphatase 04/30/2024 65  39 - 117 U/L Final   AST 04/30/2024 25  0 - 37 U/L Final   ALT 04/30/2024 36  0 - 53 U/L Final   Total Protein 04/30/2024 6.7  6.0 - 8.3 g/dL Final   Albumin 88/86/7974 4.0  3.5 - 5.2 g/dL Final   GFR 88/86/7974 75.15  >60.00 mL/min Final   Calcium  04/30/2024 9.2  8.4 - 10.5 mg/dL Final   Reflexve Urine Culture 04/30/2024    Final  Office Visit on 02/21/2024  Component Date Value Ref Range Status   WBC 02/21/2024 4.6  4.0 - 10.5 K/uL Final   RBC 02/21/2024 4.81  4.22 - 5.81 Mil/uL Final   Hemoglobin 02/21/2024 15.0  13.0 - 17.0 g/dL Final   HCT 90/94/7974 43.8  39.0 - 52.0 %  Final   MCV 02/21/2024 91.0  78.0 - 100.0 fl Final   MCHC 02/21/2024 34.2  30.0 - 36.0 g/dL Final   RDW 90/94/7974 13.7  11.5 - 15.5 % Final   Platelets 02/21/2024 150.0  150.0 - 400.0 K/uL Final    Neutrophils Relative % 02/21/2024 56.1  43.0 - 77.0 % Final   Lymphocytes Relative 02/21/2024 30.5  12.0 - 46.0 % Final   Monocytes Relative 02/21/2024 10.1  3.0 - 12.0 % Final   Eosinophils Relative 02/21/2024 2.3  0.0 - 5.0 % Final   Basophils Relative 02/21/2024 1.0  0.0 - 3.0 % Final   Neutro Abs 02/21/2024 2.6  1.4 - 7.7 K/uL Final   Lymphs Abs 02/21/2024 1.4  0.7 - 4.0 K/uL Final   Monocytes Absolute 02/21/2024 0.5  0.1 - 1.0 K/uL Final   Eosinophils Absolute 02/21/2024 0.1  0.0 - 0.7 K/uL Final   Basophils Absolute 02/21/2024 0.0  0.0 - 0.1 K/uL Final   Sodium 02/21/2024 138  135 - 145 mEq/L Final   Potassium 02/21/2024 4.2  3.5 - 5.1 mEq/L Final   Chloride 02/21/2024 102  96 - 112 mEq/L Final   CO2 02/21/2024 26  19 - 32 mEq/L Final   Glucose, Bld 02/21/2024 192 (H)  70 - 99 mg/dL Final   BUN 90/94/7974 16  6 - 23 mg/dL Final   Creatinine, Ser 02/21/2024 0.95  0.40 - 1.50 mg/dL Final   Total Bilirubin 02/21/2024 0.7  0.2 - 1.2 mg/dL Final   Alkaline Phosphatase 02/21/2024 82  39 - 117 U/L Final   AST 02/21/2024 27  0 - 37 U/L Final   ALT 02/21/2024 33  0 - 53 U/L Final   Total Protein 02/21/2024 7.0  6.0 - 8.3 g/dL Final   Albumin 90/94/7974 4.1  3.5 - 5.2 g/dL Final   GFR 90/94/7974 81.96  >60.00 mL/min Final   Calcium  02/21/2024 9.0  8.4 - 10.5 mg/dL Final   Cholesterol 90/94/7974 201 (H)  0 - 200 mg/dL Final   Triglycerides 90/94/7974 398.0 (H)  0.0 - 149.0 mg/dL Final   HDL 90/94/7974 47.00  >39.00 mg/dL Final   VLDL 90/94/7974 79.6 (H)  0.0 - 40.0 mg/dL Final   LDL Cholesterol 02/21/2024 75  0 - 99 mg/dL Final   Total CHOL/HDL Ratio 02/21/2024 4   Final   NonHDL 02/21/2024 154.41   Final   Hgb A1c MFr Bld 02/21/2024 8.2 (H)  4.6 - 6.5 % Final   Microalb, Ur 02/21/2024 0.9  0.0 - 1.9 mg/dL Final   Creatinine,U 90/94/7974 139.5  mg/dL Final   Microalb Creat Ratio 02/21/2024 6.4  0.0 - 30.0 mg/g Final  Scanned Document on 10/30/2023  Component Date Value Ref Range  Status   A1c 02/20/2023 7.1   Final   EGFR 02/20/2023 88.0   Final   EGFR 10/31/2022 87.0   Final   Microalb Creat Ratio 10/31/2022 11.0   Final   Creatinine, POC 10/31/2022 64  mg/dL Final   Microalbumin, Urine 10/31/2022 0.7   Final   Cologuard 11/01/2021 Negative  Negative Final   A1c 10/31/2022 7.2   Final  Office Visit on 10/18/2023  Component Date Value Ref Range Status   WBC 10/18/2023 5.4  4.0 - 10.5 K/uL Final   RBC 10/18/2023 4.80  4.22 - 5.81 Mil/uL Final   Hemoglobin 10/18/2023 15.0  13.0 - 17.0 g/dL Final   HCT 94/97/7974 43.9  39.0 - 52.0 % Final   MCV 10/18/2023  91.4  78.0 - 100.0 fl Final   MCHC 10/18/2023 34.1  30.0 - 36.0 g/dL Final   RDW 94/97/7974 14.0  11.5 - 15.5 % Final   Platelets 10/18/2023 165.0  150.0 - 400.0 K/uL Final   Neutrophils Relative % 10/18/2023 55.0  43.0 - 77.0 % Final   Lymphocytes Relative 10/18/2023 29.5  12.0 - 46.0 % Final   Monocytes Relative 10/18/2023 11.5  3.0 - 12.0 % Final   Eosinophils Relative 10/18/2023 3.0  0.0 - 5.0 % Final   Basophils Relative 10/18/2023 1.0  0.0 - 3.0 % Final   Neutro Abs 10/18/2023 3.0  1.4 - 7.7 K/uL Final   Lymphs Abs 10/18/2023 1.6  0.7 - 4.0 K/uL Final   Monocytes Absolute 10/18/2023 0.6  0.1 - 1.0 K/uL Final   Eosinophils Absolute 10/18/2023 0.2  0.0 - 0.7 K/uL Final   Basophils Absolute 10/18/2023 0.1  0.0 - 0.1 K/uL Final   Sodium 10/18/2023 139  135 - 145 mEq/L Final   Potassium 10/18/2023 4.4  3.5 - 5.1 mEq/L Final   Chloride 10/18/2023 102  96 - 112 mEq/L Final   CO2 10/18/2023 24  19 - 32 mEq/L Final   Glucose, Bld 10/18/2023 127 (H)  70 - 99 mg/dL Final   BUN 94/97/7974 14  6 - 23 mg/dL Final   Creatinine, Ser 10/18/2023 0.88  0.40 - 1.50 mg/dL Final   Total Bilirubin 10/18/2023 0.7  0.2 - 1.2 mg/dL Final   Alkaline Phosphatase 10/18/2023 72  39 - 117 U/L Final   AST 10/18/2023 18  0 - 37 U/L Final   ALT 10/18/2023 23  0 - 53 U/L Final   Total Protein 10/18/2023 7.1  6.0 - 8.3 g/dL Final    Albumin 94/97/7974 4.4  3.5 - 5.2 g/dL Final   GFR 94/97/7974 88.26  >60.00 mL/min Final   Calcium  10/18/2023 9.5  8.4 - 10.5 mg/dL Final   Cholesterol 94/97/7974 184  0 - 200 mg/dL Final   Triglycerides 94/97/7974 334.0 (H)  0.0 - 149.0 mg/dL Final   HDL 94/97/7974 45.60  >39.00 mg/dL Final   VLDL 94/97/7974 66.8 (H)  0.0 - 40.0 mg/dL Final   LDL Cholesterol 10/18/2023 72  0 - 99 mg/dL Final   Total CHOL/HDL Ratio 10/18/2023 4   Final   NonHDL 10/18/2023 138.82   Final   Hgb A1c MFr Bld 10/18/2023 8.0 (H)  4.6 - 6.5 % Final   Microalb, Ur 10/18/2023 <0.7  mg/dL Final   Creatinine,U 94/97/7974 60.2  mg/dL Final   Microalb Creat Ratio 10/18/2023 Unable to calculate  0.0 - 30.0 mg/g Final  No image results found. CT RENAL STONE STUDY Result Date: 05/05/2024 CLINICAL DATA:  Abdominal/flank pain, stone suspected EXAM: CT ABDOMEN AND PELVIS WITHOUT CONTRAST TECHNIQUE: Multidetector CT imaging of the abdomen and pelvis was performed following the standard protocol without IV contrast. RADIATION DOSE REDUCTION: This exam was performed according to the departmental dose-optimization program which includes automated exposure control, adjustment of the mA and/or kV according to patient size and/or use of iterative reconstruction technique. COMPARISON:  None Available. FINDINGS: Lower chest: No acute abnormality. Hepatobiliary: No suspicious focal hepatic lesion within the limits of an unenhanced exam. No gallstones, gallbladder wall thickening, or biliary dilatation. Pancreas: Unremarkable. Spleen: Unremarkable. Adrenals/Urinary Tract: Adrenal glands are unremarkable. Nonobstructive 4 mm calculus in the interpolar right kidney. No right-sided ureteral calculi or hydronephrosis. No left-sided urolithiasis or hydronephrosis. Left renal exophytic simple cysts with the largest measuring  up to 2 cm at the interpolar left kidney. Bladder is unremarkable. Stomach/Bowel: The stomach is within normal limits. Appendix  appears normal. No obstruction or inflammatory changes. A few scattered right colonic diverticula without evidence of acute diverticulitis. Vascular/Lymphatic: Abdominal aorta is normal in caliber with scattered atherosclerotic calcification. No enlarged abdominal or pelvic lymph nodes. Reproductive: Prostate is unremarkable. Other: Small fat containing umbilical hernia. No abdominopelvic ascites. No intraperitoneal free air. Musculoskeletal: No acute osseous abnormality. No suspicious osseous lesion. Mild multilevel degenerative changes of the spine. Mild degenerative changes of the bilateral hips. IMPRESSION: 1. Nonobstructive 4 mm right renal calculus.  No hydronephrosis. 2. Scattered right colonic diverticula without evidence of acute diverticulitis. 3.  Aortic Atherosclerosis (ICD10-I70.0). Electronically Signed   By: Harrietta Sherry M.D.   On: 05/05/2024 10:18   DG Abd 1 View Result Date: 05/01/2024 EXAM: 1 VIEW XRAY OF THE ABDOMEN 04/30/2024 08:40:48 AM COMPARISON: None available. CLINICAL HISTORY: right flank pain eval for kidney stone FINDINGS: BOWEL: Nonobstructive bowel gas pattern. SOFT TISSUES: Focal 4 mm calcification in the right mid-abdomen may represent a mid-pole renal stone. BONES: Degenerative changes in the spine and hips. IMPRESSION: 1. Focal 4 mm calcification in the right mid-abdomen, possibly representing a mid-pole renal stone. 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 We discussed the importance of maintaining low cholesterol levels to prevent atherosclerosis, especially with diabetes. He is open to increasing the rosuvastatin  dose for better cholesterol management. Increased  rosuvastatin  dose from 10 mg to 20 mg. Ordered a cholesterol test to assess current levels. Type 2 diabetes mellitus with hyperglycemia, without long-term current use of insulin (HCC) Type 2 diabetes mellitus   His A1c is 8.2, indicating suboptimal control. He takes metformin  500 mg twice daily but struggles with adherence due to meal timing. He avoids sugary drinks and is conscious of sugar intake. We discussed the benefits of extended-release metformin  for easier adherence and alternative diabetes management approaches, including dietary changes and Unicity, a natural fiber product that may reduce A1c levels without medication. He is open to these options. Switched to metformin  500 mg extended-release, two tablets each morning. Ordered an A1c test to assess current diabetes control. Provided information on Unicity and dietary modifications. Kidney stone Kidney stone   A 4 mm kidney stone was identified on previous imaging. He reports no current discomfort and is unsure if the stone has passed. We discussed the possibility of the stone being lodged comfortably or having passed unnoticed. He continues Flomax  to aid in stone passage. Ordered urinalysis to check for kidney stone passage and microalbumin to assess kidney function.  ORDER ASSOCIATIONS  #   DIAGNOSIS / CONDITION ICD-10 ENCOUNTER ORDER     ICD-10-CM   1. Type 2 diabetes mellitus with hyperglycemia, without long-term current use of insulin (HCC)  E11.65     2. Hyperlipidemia, unspecified hyperlipidemia type  E78.5     3. 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 Kidney stone  N20.0           This document was synthesized by artificial intelligence ([employer]bridge) using HIPAA-compliant recording of the clinical interaction;   We discussed the use of AI scribe  software for clinical note transcription with the patient, who gave verbal consent to proceed. additional Info: This encounter employed state-of-the-art,  real-time, collaborative documentation. The patient actively reviewed and assisted in updating their electronic medical record on a shared screen, ensuring transparency and facilitating joint problem-solving for the problem list, overview, and plan. This approach promotes accurate, informed care. The treatment plan was discussed and reviewed in detail, including medication safety, potential side effects, and all patient questions. We confirmed understanding and comfort with the plan. Follow-up instructions were established, including contacting the office for any concerns, returning if symptoms worsen, persist, or new symptoms develop, and precautions for potential emergency department visits.

## 2024-05-22 NOTE — Assessment & Plan Note (Signed)
 Hyperlipidemia   He manages this with rosuvastatin  but recently ran out without experiencing side effects. We discussed the importance of maintaining low cholesterol levels to prevent atherosclerosis, especially with diabetes. He is open to increasing the rosuvastatin  dose for better cholesterol management. Increased rosuvastatin  dose from 10 mg to 20 mg. Ordered a cholesterol test to assess current levels.

## 2024-05-24 ENCOUNTER — Ambulatory Visit: Payer: Self-pay | Admitting: Internal Medicine

## 2024-05-25 LAB — URINALYSIS W MICROSCOPIC + REFLEX CULTURE

## 2024-06-25 ENCOUNTER — Ambulatory Visit: Admitting: Internal Medicine

## 2024-06-25 ENCOUNTER — Encounter: Payer: Self-pay | Admitting: Internal Medicine

## 2024-06-25 VITALS — BP 130/74 | HR 70 | Temp 98.3°F | Ht 71.0 in | Wt 218.0 lb

## 2024-06-25 DIAGNOSIS — J320 Chronic maxillary sinusitis: Secondary | ICD-10-CM | POA: Diagnosis not present

## 2024-06-25 DIAGNOSIS — J4541 Moderate persistent asthma with (acute) exacerbation: Secondary | ICD-10-CM

## 2024-06-25 MED ORDER — AZITHROMYCIN 250 MG PO TABS: ORAL_TABLET | ORAL | 0 refills | Status: AC

## 2024-06-25 MED ORDER — PSEUDOEPHEDRINE HCL ER 120 MG PO TB12: 120.0000 mg | ORAL_TABLET | Freq: Two times a day (BID) | ORAL | 0 refills | Status: AC

## 2024-06-25 MED ORDER — BENZONATATE 100 MG PO CAPS: 100.0000 mg | ORAL_CAPSULE | Freq: Two times a day (BID) | ORAL | 1 refills | Status: AC | PRN

## 2024-06-25 MED ORDER — FLUTICASONE PROPIONATE 50 MCG/ACT NA SUSP: 2.0000 | Freq: Every day | NASAL | 6 refills | Status: AC

## 2024-06-25 MED ORDER — ALBUTEROL SULFATE HFA 108 (90 BASE) MCG/ACT IN AERS: 2.0000 | INHALATION_SPRAY | Freq: Four times a day (QID) | RESPIRATORY_TRACT | 2 refills | Status: AC | PRN

## 2024-06-25 MED ORDER — SIMPLY SALINE 0.9 % NA AERS: 2.0000 | INHALATION_SPRAY | NASAL | 11 refills | Status: AC

## 2024-06-25 NOTE — Patient Instructions (Addendum)
 " It was a pleasure seeing you today! Your health and satisfaction are our top priorities.  Bernardino Cone, MD  VISIT SUMMARY: You came in today because you have been experiencing sinus pressure and a sore throat since before Christmas. You were concerned about the possibility of your symptoms progressing to bronchitis or pneumonia, as has happened to you in the past. You also mentioned having a wheezy cough and difficulty sleeping due to coughing. We discussed your history of sinus infections, bronchitis, and pneumonia, and your use of azithromycin  and inhalers.  YOUR PLAN: -MODERATE PERSISTENT ASTHMATIC BRONCHITIS WITH ACUTE EXACERBATION: This means you have a chronic condition where your bronchial tubes are inflamed and constricted, causing wheezing and coughing, and it has recently worsened. We believe this is likely due to a viral infection rather than a bacterial one. We prescribed azithromycin  (Z-Pak) to address any potential bacterial infection in your sinuses. Additionally, we prescribed an inhaler to help relax your bronchial muscles and improve your breathing. We also discussed using Sudafed to reduce nasal congestion and aid sinus drainage.  -CHRONIC MAXILLARY SINUSITIS: This means you have a long-term inflammation and bacterial infection in your sinuses, causing sinus pressure and a sore throat. To help manage this, we recommended using a saline mist spray for sinus hygiene, which can help clear your sinuses and prevent future infections. We also discussed the potential use of Flonase  for inflammation, though it is not critical at this time. We prescribed Sudafed to reduce nasal congestion and aid sinus drainage.  INSTRUCTIONS: Please follow up with us  if your symptoms do not improve or if they worsen. Make sure to use the inhaler as instructed and continue with the saline mist spray and Sudafed as recommended. If you experience any new or concerning symptoms, contact our office  immediately.  Your Providers PCP: Cone Bernardino MATSU, MD,  [phone]) Referring Provider: Cone Bernardino MATSU, MD,  [phone]) Care Team Provider: Luke Orlan HERO, DO,  [phone])  NEXT STEPS: [x]  Early Intervention: Schedule sooner appointment, call our on-call services, or go to emergency room if there is any significant Increase in pain or discomfort New or worsening symptoms Sudden or severe changes in your health [x]  Flexible Follow-Up: We recommend a No follow-ups on file. for optimal routine care. This allows for progress monitoring and treatment adjustments. [x]  Preventive Care: Schedule your annual preventive care visit! It's typically covered by insurance and helps identify potential health issues early. [x]  Lab & X-ray Appointments: Incomplete tests scheduled today, or call to schedule. X-rays: [facility] (M-F, 8:30am-noon or 1pm-5pm). [x]  Medical Information Release: Sign a release form at front desk to obtain relevant medical information we don't have.  MAKING THE MOST OF OUR FOCUSED 20 MINUTE APPOINTMENTS: [x]   Clearly state your top concerns at the beginning of the visit to focus our discussion [x]   If you anticipate you will need more time, please inform the front desk during scheduling - we can book multiple appointments in the same week. [x]   If you have transportation problems- use our convenient video appointments or ask about transportation support. [x]   We can get down to business faster if you use MyChart to update information before the visit and submit non-urgent questions before your visit. Thank you for taking the time to provide details through MyChart.  Let our nurse know and she can import this information into your encounter documents.  Arrival and Wait Times: [x]   Arriving on time ensures that everyone receives prompt attention. [x]   Early morning (8a) and afternoon (1p) appointments tend to have shortest wait times. [x]   Unfortunately,  we cannot delay appointments for late arrivals or hold slots during phone calls.  Getting Answers and Following Up [x]   Simple Questions & Concerns: For quick questions or basic follow-up after your visit, reach us  at [phone] or MyChart messaging. [x]   Complex Concerns: If your concern is more complex, scheduling an appointment might be best. Discuss this with the staff to find the most suitable option. [x]   Lab & Imaging Results: We'll contact you directly if results are abnormal or you don't use MyChart. Most normal results will be on MyChart within 2-3 business days, with a review message from Dr. Jesus. Haven't heard back in 2 weeks? Need results sooner? Contact us  at [phone]. [x]   Referrals: Our referral coordinator will manage specialist referrals. The specialist's office should contact you within 2 weeks to schedule an appointment. Call us  if you haven't heard from them after 2 weeks.  Staying Connected [x]   MyChart: Activate your MyChart for the fastest way to access results and message us . See the last page of this paperwork for instructions on how to activate.  Bring to Your Next Appointment [x]   Medications: Please bring all your medication bottles to your next appointment to ensure we have an accurate record of your prescriptions. [x]   Health Diaries: If you're monitoring any health conditions at home, keeping a diary of your readings can be very helpful for discussions at your next appointment.  Billing [x]   X-ray & Lab Orders: These are billed by separate companies. Contact the invoicing company directly for questions or concerns. [x]   Visit Charges: Discuss any billing inquiries with our administrative services team.  Your Satisfaction Matters [x]   Share Your Experience: We strive for your satisfaction! If you have any complaints, or preferably compliments, please let Dr. Jesus know directly or contact our Practice Administrators, Manuelita Rubin or [employer], by asking at the front desk.   Reviewing Your Records [x]   Review this early draft of your clinical encounter notes below and the final encounter summary tomorrow on MyChart after its been completed.  All orders placed so far are visible here: Moderate persistent asthmatic bronchitis with acute exacerbation -     Azithromycin ; Take 2 tablets on day 1, then 1 tablet daily on days 2 through 5  Dispense: 6 tablet; Refill: 0 -     Benzonatate ; Take 1 capsule (100 mg total) by mouth 2 (two) times daily as needed for cough.  Dispense: 100 capsule; Refill: 1 -     Albuterol  Sulfate HFA; Inhale 2 puffs into the lungs every 6 (six) hours as needed for wheezing or shortness of breath.  Dispense: 8.5 g; Refill: 2 -     Fluticasone  Propionate; Place 2 sprays into both nostrils daily.  Dispense: 16 g; Refill: 6 -     Simply Saline; Place 2 each into the nose as directed. Use nightly for sinus hygiene long-term.  Can also be used as many times daily as desired to assist with clearing congested sinuses.  Dispense: 127 mL; Refill: 11 -     Pseudoephedrine  HCl ER; Take 1 tablet (120 mg total) by mouth 2 (two) times daily.  Dispense: 20 tablet; Refill: 0  Chronic maxillary sinusitis -     Azithromycin ; Take 2 tablets on day 1, then 1 tablet daily on days 2 through 5  Dispense: 6 tablet; Refill: 0 -     Benzonatate ;  Take 1 capsule (100 mg total) by mouth 2 (two) times daily as needed for cough.  Dispense: 100 capsule; Refill: 1 -     Albuterol  Sulfate HFA; Inhale 2 puffs into the lungs every 6 (six) hours as needed for wheezing or shortness of breath.  Dispense: 8.5 g; Refill: 2 -     Fluticasone  Propionate; Place 2 sprays into both nostrils daily.  Dispense: 16 g; Refill: 6 -     Simply Saline; Place 2 each into the nose as directed. Use nightly for sinus hygiene long-term.  Can also be used as many times daily as desired to assist with clearing congested sinuses.  Dispense: 127 mL; Refill: 11 -      Pseudoephedrine  HCl ER; Take 1 tablet (120 mg total) by mouth 2 (two) times daily.  Dispense: 20 tablet; Refill: 0       ALLERGY MANAGEMENT PLAN  This plan is designed to help manage your allergic rhinitis (nasal allergies) effectively. Follow these steps daily for best results.  Sinus saline sprays- use nightly, and after sneezing episodes or exposure to allergen.  Insert deeply and spray mist into nose while leaning over sink at 45 degrees,  while gently breathing. Also blow out onto tissue while leaning forward 45 degrees. Once daily, after a sinus rinse, use sensimist.  Just before bedtime is best. This only needed if allergies acting up.  If this is inadequate add-on once daily for levocetirizine / xyzal 5 mg for nondrowsy antihistamine Take benadryl 25 mg at bedtime also if allergic mucus is persisting  When allergies cause chronic swelling in sinuses, it leads to sinus infections:    DAILY TREATMENT ROUTINE   Time of Day Treatment Steps  Morning 1. Saline Nasal Spray - Use to cleanse nasal passages 2. Xyzal (levocetirizine) - Take one tablet daily   Throughout Day Saline Nasal Spray - Use 2 additional times (mid-day and afternoon)   Evening/Bedtime 1. Saline Nasal Rinse - Thoroughly clean nasal passages 2. Flonase  Sensimist - Apply after nasal rinse 3. Benadryl (diphenhydramine) - Take 25mg  if experiencing persistent congestion    PROPER TECHNIQUE GUIDE       Saline Nasal Spray/Rinse Technique: Lean forward over sink at a 45-degree angle Turn head slightly to one side Insert spray tip into upper nostril Spray gently while breathing lightly through your nose Repeat on other side Gently blow nose to clear excess solution Use saline spray 3 times daily to keep nasal passages moist and clear allergens.       Flonase  Sensimist Technique: Shake bottle gently before each use Prime the bottle if it's new or hasn't been used for a week Tilt your head forward slightly Insert  tip into nostril, pointing away from the center of your nose Spray while inhaling gently Repeat in other nostril Use Flonase  Sensimist once daily, preferably at bedtime after using saline rinse. It may take several days of regular use to feel maximum benefit.   WHY FLONASE  SENSIMIST?   Benefits of Flonase  Sensimist:  Alcohol-free and scent-free formula - gentler on sensitive nasal passages Fine mist application - more comfortable with less dripping down throat Effectively relieves nasal congestion, sneezing, runny nose, and even eye symptoms 24-hour relief with once-daily dosing Uses a more potent form of fluticasone  that works at a lower dose Less liquid per spray means less discomfort  UNDERSTANDING YOUR MEDICATIONS   Medication How It Works Important Notes  Flonase  Sensimist (fluticasone  furoate) Reduces inflammation in nasal passages, addressing the underlying  cause of allergy symptoms - Takes several days for full effect - Use daily for best results - Safe for long-term use   Xyzal (levocetirizine) Blocks histamine to reduce allergy symptoms like sneezing and itching - Take at the same time each day - May cause drowsiness in some people - Once-daily dosing   Benadryl (diphenhydramine) Antihistamine that provides additional relief for breakthrough symptoms - Causes drowsiness - Use only at bedtime - For occasional use when needed   Saline Spray/Rinse Physically removes allergens and moistens nasal passages - Safe to use frequently - Improves effectiveness of other treatments - Reduces nasal irritation    CONTACT YOUR PROVIDER IF: Your symptoms do not improve after 1-2 weeks of following this plan You develop sinus pain with fever or green/yellow discharge You experience frequent nosebleeds You develop new or worsening symptoms You have questions about your treatment plan     ADDITIONAL ALLERGY MANAGEMENT TIPS   HELPFUL STRATEGIES: ?? Keep windows closed during high  pollen seasons ??? Use allergen-proof covers for pillows and mattresses ?? Vacuum regularly with a HEPA filter vacuum ?? Shower and change clothes after spending time outdoors ?? Check local pollen counts and limit outdoor time when counts are high ?? Stay well-hydrated to help keep mucous membranes moist      "

## 2024-06-25 NOTE — Progress Notes (Signed)
 ==============================  [facility]: [phone]   -- Medical Office Visit --  Patient: Joseph Cummings      Age: 69 y.o.       Sex:  male  Date:   06/25/2024 Today's Healthcare Provider: Bernardino KANDICE Cone, MD  ==============================   Chief Complaint: Nasal Congestion (Has been going on for couple weeks ) and Cough (Coughing as well has been going on for couple weeks needs more cough meds 100mg  benzonatate  zpack help before with congestion and all )   Discussed the use of AI scribe software for clinical note transcription with the patient, who gave verbal consent to proceed.  History of Present Illness 69 year old male with a history of sinus infections and bronchitis who presents with sinus pressure and sore throat.  He has been experiencing sinus pressure and a sore throat since before Christmas, initially attributing it to a head cold. He is concerned about the potential progression of sinus infections to bronchitis or pneumonia, as has occurred twice in the past.  He uses azithromycin  when symptoms progress to chest involvement with coughing and hacking, which he believes helps prevent bronchitis. He mentions having a few azithromycin  pills left and describes them as a 'godsend'.  He experiences discomfort in the lower lungs and reports a wheezy cough, which worsens with deep breaths. He has a history of pneumonia and has used inhalers and breathing treatments in the past. No asthma, but he reports wheezing and discomfort in the lower lungs. He experiences coughing when taking deep breaths and has difficulty sleeping due to coughing.  He cannot lay down to sleep due to coughing and has been sleeping sitting up in an easy chair. He mentions that others he knows are experiencing similar symptoms.  He has not received pneumonia vaccinations and has a history of avoiding flu shots, except when mandated by work. He recalls having severe  flu symptoms after receiving a flu shot in the past.  He uses cough pills regularly, even for simple colds, to help with sleep by reducing coughing. He requests a refill of 100 pills.  Background Reviewed: Problem List: has Pruritus; Rash and other nonspecific skin eruption; Hypertriglyceridemia; Type 2 diabetes mellitus with hyperglycemia (HCC); and Hyperlipidemia on their problem list. Past Medical History:  has a past medical history of Arthritis, Diabetes mellitus (HCC) (01/2018), Fracture of metacarpal bone (10/12/2021), History of kidney stones, and Hypertriglyceridemia (07/2016). Past Surgical History:   has a past surgical history that includes Kidney stone surgery. Social History:   reports that he quit smoking about 33 years ago. His smoking use included cigarettes. He started smoking about 43 years ago. He has a 10 pack-year smoking history. He has never used smokeless tobacco. He reports current alcohol use. He reports that he does not use drugs. Family History:  family history includes Arthritis in his father; Heart disease in his father; Hyperlipidemia in his father and mother; Hypertension in his father and mother. Allergies:  is allergic to sulfa antibiotics and formaldehyde.   Medication Reconciliation: Current Outpatient Medications on File Prior to Visit  Medication Sig   acetaminophen  (TYLENOL ) 500 MG tablet Take 1 tablet (500 mg total) by mouth every 6 (six) hours as needed.   Ascorbic Acid (VITAMIN C) 1000 MG tablet Take 1,000 mg by mouth daily.   B Complex Vitamins (VITAMIN B COMPLEX) CAPS as directed Orally   cholecalciferol (VITAMIN D3) 25 MCG (1000 UNIT) tablet Take 1,000 Units by mouth  daily.   Cholecalciferol 50 MCG (2000 UT) CAPS 1 capsule.   Continuous Glucose Sensor (FREESTYLE LIBRE 3 PLUS SENSOR) MISC Change sensor every 15 days. (Patient not taking: Reported on 04/30/2024)   Docosahexaenoic Acid (DHA ALGAL-900 PO) Take by mouth.   fexofenadine (ALLEGRA) 180 MG  tablet Take 180 mg by mouth daily.   Garlic 500 MG TABS Take by mouth.   Ginger, Zingiber officinalis, (GINGER PO) Take 300 mg by mouth.   Guggulipid-Black Pepper (GUGLIPID/BIOPERINE PO) Take by mouth.   magnesium 30 MG tablet Take 30 mg by mouth daily.    metFORMIN  (GLUCOPHAGE -XR) 500 MG 24 hr tablet Take 1 tablet (500 mg total) by mouth daily with breakfast.   neomycin-polymyxin-hydrocortisone  (CORTISPORIN) OTIC solution Place 3 drops into both ears 2 (two) times daily. (Patient not taking: Reported on 05/22/2024)   Omega-3 Fatty Acids  (EPA PO) Take by mouth.   Omega-3 Fatty Acids  (OMEGA 3 FISH OIL  PO) Take by mouth. (Patient taking differently: Take by mouth. 2,290mg  daily)   rosuvastatin  (CRESTOR ) 10 MG tablet Take 1 tablet (10 mg total) by mouth daily. (Patient not taking: Reported on 02/21/2024)   rosuvastatin  (CRESTOR ) 20 MG tablet Take 1 tablet (20 mg total) by mouth daily.   tamsulosin  (FLOMAX ) 0.4 MG CAPS capsule Take 1 capsule (0.4 mg total) by mouth daily.   triamcinolone  ointment (KENALOG ) 0.1 % Apply 1 application topically 2 (two) times daily. Below the neck up to 3 weeks at a time.   Turmeric 1053 MG TABS Take 1 tablet by mouth daily at 6 (six) AM.   vitamin E  180 MG (400 UNITS) capsule 1 capsule.   Zinc  Citrate-Phytase 25-500 MG CAPS Take 1 capsule by mouth daily.   [DISCONTINUED] budesonide -formoterol  (SYMBICORT ) 160-4.5 MCG/ACT inhaler Inhale 2 puffs into the lungs 2 (two) times daily.   No current facility-administered medications on file prior to visit.  There are no discontinued medications.   Physical Exam:    06/25/2024   11:28 AM 05/22/2024    9:20 AM 04/30/2024    8:01 AM  Vitals with BMI  Height 5' 11 5' 11 5' 11  Weight 218 lbs 216 lbs 5 oz 220 lbs 3 oz  BMI 30.42 30.18 30.73  Systolic 130 118 869  Diastolic 74 70 76  Pulse 70 88 78  Vital signs reviewed.  Nursing notes reviewed. Weight trend reviewed. Physical Activity: Insufficiently Active (10/30/2023)    Exercise Vital Sign    Days of Exercise per Week: 2 days    Minutes of Exercise per Session: 20 min   General Appearance:  No acute distress appreciable.   Well-groomed, healthy-appearing male.  Well proportioned with no abnormal fat distribution.  Good muscle tone. Pulmonary:  Normal work of breathing at rest, no respiratory distress apparent. SpO2: 98 %  Musculoskeletal: All extremities are intact.  Neurological:  Awake, alert, oriented, and engaged.  No obvious focal neurological deficits or cognitive impairments.  Sensorium seems unclouded.   Speech is clear and coherent with logical content. Psychiatric:  Appropriate mood, pleasant and cooperative demeanor, thoughtful and engaged during the exam  Verbalized to patient: Physical Exam HEENT: Nasal mucosa erythematous. Sniffles and throat clearing but not ill appearing CHEST: Wheezing on lung auscultation. All fields, faint except during coughing loud panlobular wheezes= normal work of breathing       02/21/2024   10:09 AM 10/18/2023    9:16 AM 01/17/2018    1:08 PM  PHQ 2/9 Scores  PHQ - 2 Score 0 0  0  PHQ- 9 Score  0  0      Data saved with a previous flowsheet row definition    Office Visit on 05/22/2024  Component Date Value Ref Range Status   WBC 05/22/2024 4.7  4.0 - 10.5 K/uL Final   RBC 05/22/2024 4.77  4.22 - 5.81 Mil/uL Final   Hemoglobin 05/22/2024 14.6  13.0 - 17.0 g/dL Final   HCT 87/94/7974 42.4  39.0 - 52.0 % Final   MCV 05/22/2024 88.9  78.0 - 100.0 fl Final   MCHC 05/22/2024 34.6  30.0 - 36.0 g/dL Final   RDW 87/94/7974 13.5  11.5 - 15.5 % Final   Platelets 05/22/2024 153.0  150.0 - 400.0 K/uL Final   Neutrophils Relative % 05/22/2024 59.0  43.0 - 77.0 % Final   Lymphocytes Relative 05/22/2024 27.6  12.0 - 46.0 % Final   Monocytes Relative 05/22/2024 10.6  3.0 - 12.0 % Final   Eosinophils Relative 05/22/2024 2.1  0.0 - 5.0 % Final   Basophils Relative 05/22/2024 0.7  0.0 - 3.0 % Final   Neutro Abs 05/22/2024  2.8  1.4 - 7.7 K/uL Final   Lymphs Abs 05/22/2024 1.3  0.7 - 4.0 K/uL Final   Monocytes Absolute 05/22/2024 0.5  0.1 - 1.0 K/uL Final   Eosinophils Absolute 05/22/2024 0.1  0.0 - 0.7 K/uL Final   Basophils Absolute 05/22/2024 0.0  0.0 - 0.1 K/uL Final   Sodium 05/22/2024 136  135 - 145 mEq/L Final   Potassium 05/22/2024 4.0  3.5 - 5.1 mEq/L Final   Chloride 05/22/2024 101  96 - 112 mEq/L Final   CO2 05/22/2024 26  19 - 32 mEq/L Final   Glucose, Bld 05/22/2024 222 (H)  70 - 99 mg/dL Final   BUN 87/94/7974 17  6 - 23 mg/dL Final   Creatinine, Ser 05/22/2024 0.82  0.40 - 1.50 mg/dL Final   Total Bilirubin 05/22/2024 0.9  0.2 - 1.2 mg/dL Final   Alkaline Phosphatase 05/22/2024 62  39 - 117 U/L Final   AST 05/22/2024 23  0 - 37 U/L Final   ALT 05/22/2024 29  0 - 53 U/L Final   Total Protein 05/22/2024 6.7  6.0 - 8.3 g/dL Final   Albumin 87/94/7974 4.2  3.5 - 5.2 g/dL Final   GFR 87/94/7974 89.78  >60.00 mL/min Final   Calcium  05/22/2024 9.1  8.4 - 10.5 mg/dL Final   Cholesterol 87/94/7974 127  0 - 200 mg/dL Final   Triglycerides 87/94/7974 261.0 (H)  0.0 - 149.0 mg/dL Final   HDL 87/94/7974 49.20  >39.00 mg/dL Final   VLDL 87/94/7974 52.2 (H)  0.0 - 40.0 mg/dL Final   LDL Cholesterol 05/22/2024 26  0 - 99 mg/dL Final   Total CHOL/HDL Ratio 05/22/2024 3   Final   NonHDL 05/22/2024 78.27   Final   Hgb A1c MFr Bld 05/22/2024 8.6 (H)  4.6 - 6.5 % Final   Microalb, Ur 05/22/2024 0.8  0.0 - 1.9 mg/dL Final   Creatinine,U 87/94/7974 136.9  mg/dL Final   Microalb Creat Ratio 05/22/2024 6.0  0.0 - 30.0 mg/g Final   Color, Urine 05/22/2024 CANCELED   Final  Office Visit on 04/30/2024  Component Date Value Ref Range Status   Color, Urine 04/30/2024 YELLOW  YELLOW Final   APPearance 04/30/2024 CLEAR  CLEAR Final   Specific Gravity, Urine 04/30/2024 1.026  1.001 - 1.035 Final   pH 04/30/2024 6.0  5.0 - 8.0 Final   Glucose,  UA 04/30/2024 3+ (A)  NEGATIVE Final   Bilirubin Urine 04/30/2024  NEGATIVE  NEGATIVE Final   Ketones, ur 04/30/2024 TRACE (A)  NEGATIVE Final   Hgb urine dipstick 04/30/2024 NEGATIVE  NEGATIVE Final   Protein, ur 04/30/2024 NEGATIVE  NEGATIVE Final   Nitrites, Initial 04/30/2024 NEGATIVE  NEGATIVE Final   Leukocyte Esterase 04/30/2024 NEGATIVE  NEGATIVE Final   Note 04/30/2024    Final   WBC 04/30/2024 6.4  4.0 - 10.5 K/uL Final   RBC 04/30/2024 4.69  4.22 - 5.81 Mil/uL Final   Hemoglobin 04/30/2024 14.7  13.0 - 17.0 g/dL Final   HCT 88/86/7974 42.1  39.0 - 52.0 % Final   MCV 04/30/2024 89.6  78.0 - 100.0 fl Final   MCHC 04/30/2024 35.0  30.0 - 36.0 g/dL Final   RDW 88/86/7974 13.0  11.5 - 15.5 % Final   Platelets 04/30/2024 153.0  150.0 - 400.0 K/uL Final   Neutrophils Relative % 04/30/2024 65.7  43.0 - 77.0 % Final   Lymphocytes Relative 04/30/2024 22.7  12.0 - 46.0 % Final   Monocytes Relative 04/30/2024 9.1  3.0 - 12.0 % Final   Eosinophils Relative 04/30/2024 1.7  0.0 - 5.0 % Final   Basophils Relative 04/30/2024 0.8  0.0 - 3.0 % Final   Neutro Abs 04/30/2024 4.2  1.4 - 7.7 K/uL Final   Lymphs Abs 04/30/2024 1.5  0.7 - 4.0 K/uL Final   Monocytes Absolute 04/30/2024 0.6  0.1 - 1.0 K/uL Final   Eosinophils Absolute 04/30/2024 0.1  0.0 - 0.7 K/uL Final   Basophils Absolute 04/30/2024 0.1  0.0 - 0.1 K/uL Final   Sodium 04/30/2024 139  135 - 145 mEq/L Final   Potassium 04/30/2024 4.1  3.5 - 5.1 mEq/L Final   Chloride 04/30/2024 103  96 - 112 mEq/L Final   CO2 04/30/2024 26  19 - 32 mEq/L Final   Glucose, Bld 04/30/2024 242 (H)  70 - 99 mg/dL Final   BUN 88/86/7974 17  6 - 23 mg/dL Final   Creatinine, Ser 04/30/2024 1.02  0.40 - 1.50 mg/dL Final   Total Bilirubin 04/30/2024 0.7  0.2 - 1.2 mg/dL Final   Alkaline Phosphatase 04/30/2024 65  39 - 117 U/L Final   AST 04/30/2024 25  0 - 37 U/L Final   ALT 04/30/2024 36  0 - 53 U/L Final   Total Protein 04/30/2024 6.7  6.0 - 8.3 g/dL Final   Albumin 88/86/7974 4.0  3.5 - 5.2 g/dL Final   GFR  88/86/7974 75.15  >60.00 mL/min Final   Calcium  04/30/2024 9.2  8.4 - 10.5 mg/dL Final   Reflexve Urine Culture 04/30/2024    Final  Office Visit on 02/21/2024  Component Date Value Ref Range Status   WBC 02/21/2024 4.6  4.0 - 10.5 K/uL Final   RBC 02/21/2024 4.81  4.22 - 5.81 Mil/uL Final   Hemoglobin 02/21/2024 15.0  13.0 - 17.0 g/dL Final   HCT 90/94/7974 43.8  39.0 - 52.0 % Final   MCV 02/21/2024 91.0  78.0 - 100.0 fl Final   MCHC 02/21/2024 34.2  30.0 - 36.0 g/dL Final   RDW 90/94/7974 13.7  11.5 - 15.5 % Final   Platelets 02/21/2024 150.0  150.0 - 400.0 K/uL Final   Neutrophils Relative % 02/21/2024 56.1  43.0 - 77.0 % Final   Lymphocytes Relative 02/21/2024 30.5  12.0 - 46.0 % Final   Monocytes Relative 02/21/2024 10.1  3.0 - 12.0 % Final  Eosinophils Relative 02/21/2024 2.3  0.0 - 5.0 % Final   Basophils Relative 02/21/2024 1.0  0.0 - 3.0 % Final   Neutro Abs 02/21/2024 2.6  1.4 - 7.7 K/uL Final   Lymphs Abs 02/21/2024 1.4  0.7 - 4.0 K/uL Final   Monocytes Absolute 02/21/2024 0.5  0.1 - 1.0 K/uL Final   Eosinophils Absolute 02/21/2024 0.1  0.0 - 0.7 K/uL Final   Basophils Absolute 02/21/2024 0.0  0.0 - 0.1 K/uL Final   Sodium 02/21/2024 138  135 - 145 mEq/L Final   Potassium 02/21/2024 4.2  3.5 - 5.1 mEq/L Final   Chloride 02/21/2024 102  96 - 112 mEq/L Final   CO2 02/21/2024 26  19 - 32 mEq/L Final   Glucose, Bld 02/21/2024 192 (H)  70 - 99 mg/dL Final   BUN 90/94/7974 16  6 - 23 mg/dL Final   Creatinine, Ser 02/21/2024 0.95  0.40 - 1.50 mg/dL Final   Total Bilirubin 02/21/2024 0.7  0.2 - 1.2 mg/dL Final   Alkaline Phosphatase 02/21/2024 82  39 - 117 U/L Final   AST 02/21/2024 27  0 - 37 U/L Final   ALT 02/21/2024 33  0 - 53 U/L Final   Total Protein 02/21/2024 7.0  6.0 - 8.3 g/dL Final   Albumin 90/94/7974 4.1  3.5 - 5.2 g/dL Final   GFR 90/94/7974 81.96  >60.00 mL/min Final   Calcium  02/21/2024 9.0  8.4 - 10.5 mg/dL Final   Cholesterol 90/94/7974 201 (H)  0 - 200  mg/dL Final   Triglycerides 90/94/7974 398.0 (H)  0.0 - 149.0 mg/dL Final   HDL 90/94/7974 47.00  >39.00 mg/dL Final   VLDL 90/94/7974 79.6 (H)  0.0 - 40.0 mg/dL Final   LDL Cholesterol 02/21/2024 75  0 - 99 mg/dL Final   Total CHOL/HDL Ratio 02/21/2024 4   Final   NonHDL 02/21/2024 154.41   Final   Hgb A1c MFr Bld 02/21/2024 8.2 (H)  4.6 - 6.5 % Final   Microalb, Ur 02/21/2024 0.9  0.0 - 1.9 mg/dL Final   Creatinine,U 90/94/7974 139.5  mg/dL Final   Microalb Creat Ratio 02/21/2024 6.4  0.0 - 30.0 mg/g Final  Scanned Document on 10/30/2023  Component Date Value Ref Range Status   A1c 02/20/2023 7.1   Final   EGFR 02/20/2023 88.0   Final   EGFR 10/31/2022 87.0   Final   Microalb Creat Ratio 10/31/2022 11.0   Final   Creatinine, POC 10/31/2022 64  mg/dL Final   Microalbumin, Urine 10/31/2022 0.7   Final   Cologuard 11/01/2021 Negative  Negative Final   A1c 10/31/2022 7.2   Final  Office Visit on 10/18/2023  Component Date Value Ref Range Status   WBC 10/18/2023 5.4  4.0 - 10.5 K/uL Final   RBC 10/18/2023 4.80  4.22 - 5.81 Mil/uL Final   Hemoglobin 10/18/2023 15.0  13.0 - 17.0 g/dL Final   HCT 94/97/7974 43.9  39.0 - 52.0 % Final   MCV 10/18/2023 91.4  78.0 - 100.0 fl Final   MCHC 10/18/2023 34.1  30.0 - 36.0 g/dL Final   RDW 94/97/7974 14.0  11.5 - 15.5 % Final   Platelets 10/18/2023 165.0  150.0 - 400.0 K/uL Final   Neutrophils Relative % 10/18/2023 55.0  43.0 - 77.0 % Final   Lymphocytes Relative 10/18/2023 29.5  12.0 - 46.0 % Final   Monocytes Relative 10/18/2023 11.5  3.0 - 12.0 % Final   Eosinophils Relative 10/18/2023 3.0  0.0 -  5.0 % Final   Basophils Relative 10/18/2023 1.0  0.0 - 3.0 % Final   Neutro Abs 10/18/2023 3.0  1.4 - 7.7 K/uL Final   Lymphs Abs 10/18/2023 1.6  0.7 - 4.0 K/uL Final   Monocytes Absolute 10/18/2023 0.6  0.1 - 1.0 K/uL Final   Eosinophils Absolute 10/18/2023 0.2  0.0 - 0.7 K/uL Final   Basophils Absolute 10/18/2023 0.1  0.0 - 0.1 K/uL Final    Sodium 10/18/2023 139  135 - 145 mEq/L Final   Potassium 10/18/2023 4.4  3.5 - 5.1 mEq/L Final   Chloride 10/18/2023 102  96 - 112 mEq/L Final   CO2 10/18/2023 24  19 - 32 mEq/L Final   Glucose, Bld 10/18/2023 127 (H)  70 - 99 mg/dL Final   BUN 94/97/7974 14  6 - 23 mg/dL Final   Creatinine, Ser 10/18/2023 0.88  0.40 - 1.50 mg/dL Final   Total Bilirubin 10/18/2023 0.7  0.2 - 1.2 mg/dL Final   Alkaline Phosphatase 10/18/2023 72  39 - 117 U/L Final   AST 10/18/2023 18  0 - 37 U/L Final   ALT 10/18/2023 23  0 - 53 U/L Final   Total Protein 10/18/2023 7.1  6.0 - 8.3 g/dL Final   Albumin 94/97/7974 4.4  3.5 - 5.2 g/dL Final   GFR 94/97/7974 88.26  >60.00 mL/min Final   Calcium  10/18/2023 9.5  8.4 - 10.5 mg/dL Final   Cholesterol 94/97/7974 184  0 - 200 mg/dL Final   Triglycerides 94/97/7974 334.0 (H)  0.0 - 149.0 mg/dL Final   HDL 94/97/7974 45.60  >39.00 mg/dL Final   VLDL 94/97/7974 66.8 (H)  0.0 - 40.0 mg/dL Final   LDL Cholesterol 10/18/2023 72  0 - 99 mg/dL Final   Total CHOL/HDL Ratio 10/18/2023 4   Final   NonHDL 10/18/2023 138.82   Final   Hgb A1c MFr Bld 10/18/2023 8.0 (H)  4.6 - 6.5 % Final   Microalb, Ur 10/18/2023 <0.7  mg/dL Final   Creatinine,U 94/97/7974 60.2  mg/dL Final   Microalb Creat Ratio 10/18/2023 Unable to calculate  0.0 - 30.0 mg/g Final  No image results found. CT RENAL STONE STUDY Result Date: 05/05/2024 CLINICAL DATA:  Abdominal/flank pain, stone suspected EXAM: CT ABDOMEN AND PELVIS WITHOUT CONTRAST TECHNIQUE: Multidetector CT imaging of the abdomen and pelvis was performed following the standard protocol without IV contrast. RADIATION DOSE REDUCTION: This exam was performed according to the departmental dose-optimization program which includes automated exposure control, adjustment of the mA and/or kV according to patient size and/or use of iterative reconstruction technique. COMPARISON:  None Available. FINDINGS: Lower chest: No acute abnormality. Hepatobiliary:  No suspicious focal hepatic lesion within the limits of an unenhanced exam. No gallstones, gallbladder wall thickening, or biliary dilatation. Pancreas: Unremarkable. Spleen: Unremarkable. Adrenals/Urinary Tract: Adrenal glands are unremarkable. Nonobstructive 4 mm calculus in the interpolar right kidney. No right-sided ureteral calculi or hydronephrosis. No left-sided urolithiasis or hydronephrosis. Left renal exophytic simple cysts with the largest measuring up to 2 cm at the interpolar left kidney. Bladder is unremarkable. Stomach/Bowel: The stomach is within normal limits. Appendix appears normal. No obstruction or inflammatory changes. A few scattered right colonic diverticula without evidence of acute diverticulitis. Vascular/Lymphatic: Abdominal aorta is normal in caliber with scattered atherosclerotic calcification. No enlarged abdominal or pelvic lymph nodes. Reproductive: Prostate is unremarkable. Other: Small fat containing umbilical hernia. No abdominopelvic ascites. No intraperitoneal free air. Musculoskeletal: No acute osseous abnormality. No suspicious osseous lesion. Mild multilevel degenerative  changes of the spine. Mild degenerative changes of the bilateral hips. IMPRESSION: 1. Nonobstructive 4 mm right renal calculus.  No hydronephrosis. 2. Scattered right colonic diverticula without evidence of acute diverticulitis. 3.  Aortic Atherosclerosis (ICD10-I70.0). Electronically Signed   By: Harrietta Sherry M.D.   On: 05/05/2024 10:18   DG Abd 1 View Result Date: 05/01/2024 EXAM: 1 VIEW XRAY OF THE ABDOMEN 04/30/2024 08:40:48 AM COMPARISON: None available. CLINICAL HISTORY: right flank pain eval for kidney stone FINDINGS: BOWEL: Nonobstructive bowel gas pattern. SOFT TISSUES: Focal 4 mm calcification in the right mid-abdomen may represent a mid-pole renal stone. BONES: Degenerative changes in the spine and hips. IMPRESSION: 1. Focal 4 mm calcification in the right mid-abdomen, possibly representing  a mid-pole renal stone. 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 A viral infection such as RSV is considered in the differential. Azithromycin  may not address the bronchial component but could help with any bacterial component in the sinuses. Prescribed azithromycin  (Z-Pak) for potential bacterial involvement in the sinuses. An inhaler is prescribed to relax bronchial muscles and improve breathing. He was educated on proper inhaler technique to ensure effective medication delivery to the lungs. Discussed the potential use of Sudafed to reduce nasal congestion and aid sinus drainage. Chronic maxillary sinusitis Chronic maxillary sinusitis   He has inflammation and bacterial infection with symptoms of sinus pressure and sore throat. Sinus hygiene is crucial to prevent recurrence and progression to bronchitis or pneumonia. Recommended saline mist spray for sinus hygiene, which, although not supported by large studies, is believed to be effective based on personal and patient experiences. Educated on using saline mist spray to clear sinuses and prevent future infections. Discussed potential use of Flonase  for inflammation, though it is not critical at this time. Prescribed Sudafed to reduce nasal congestion and aid sinus drainage.  ORDER ASSOCIATIONS  #   DIAGNOSIS / CONDITION ICD-10 ENCOUNTER ORDER     ICD-10-CM   1. Moderate persistent asthmatic bronchitis with acute exacerbation  J45.41 azithromycin  (ZITHROMAX ) 250 MG tablet    benzonatate  (TESSALON ) 100 MG capsule    albuterol  (VENTOLIN  HFA) 108 (90  Base) MCG/ACT inhaler    fluticasone  (FLONASE ) 50 MCG/ACT nasal spray    Saline (SIMPLY SALINE) 0.9 % AERS    pseudoephedrine  (SUDAFED 12 HOUR) 120 MG 12 hr tablet    2. Chronic maxillary sinusitis  J32.0 azithromycin  (ZITHROMAX ) 250 MG tablet    benzonatate  (TESSALON ) 100 MG capsule    albuterol  (VENTOLIN  HFA) 108 (90 Base) MCG/ACT inhaler    fluticasone  (FLONASE ) 50 MCG/ACT nasal spray    Saline (SIMPLY SALINE) 0.9 % AERS    pseudoephedrine  (SUDAFED 12 HOUR) 120 MG 12 hr tablet           Orders Placed in Encounter:    Meds ordered this encounter  Medications   azithromycin  (ZITHROMAX ) 250 MG tablet    Sig: Take 2 tablets on day 1, then 1 tablet daily on days 2 through 5    Dispense:  6 tablet    Refill:  0   benzonatate  (TESSALON ) 100 MG capsule    Sig: Take 1 capsule (100 mg total)  by mouth 2 (two) times daily as needed for cough.    Dispense:  100 capsule    Refill:  1   albuterol  (VENTOLIN  HFA) 108 (90 Base) MCG/ACT inhaler    Sig: Inhale 2 puffs into the lungs every 6 (six) hours as needed for wheezing or shortness of breath.    Dispense:  8.5 g    Refill:  2   fluticasone  (FLONASE ) 50 MCG/ACT nasal spray    Sig: Place 2 sprays into both nostrils daily.    Dispense:  16 g    Refill:  6   Saline (SIMPLY SALINE) 0.9 % AERS    Sig: Place 2 each into the nose as directed. Use nightly for sinus hygiene long-term.  Can also be used as many times daily as desired to assist with clearing congested sinuses.    Dispense:  127 mL    Refill:  11   pseudoephedrine  (SUDAFED 12 HOUR) 120 MG 12 hr tablet    Sig: Take 1 tablet (120 mg total) by mouth 2 (two) times daily.    Dispense:  20 tablet    Refill:  0    No orders of the defined types were placed in this encounter.  ED Discharge Orders          Ordered    azithromycin  (ZITHROMAX ) 250 MG tablet        06/25/24 1202    benzonatate  (TESSALON ) 100 MG capsule  2 times daily PRN        06/25/24 1202    albuterol  (VENTOLIN   HFA) 108 (90 Base) MCG/ACT inhaler  Every 6 hours PRN        06/25/24 1202    fluticasone  (FLONASE ) 50 MCG/ACT nasal spray  Daily        06/25/24 1202    Saline (SIMPLY SALINE) 0.9 % AERS  As directed        06/25/24 1202    pseudoephedrine  (SUDAFED 12 HOUR) 120 MG 12 hr tablet  2 times daily        06/25/24 1202              This document was synthesized by artificial intelligence ([employer]) using HIPAA-compliant recording of the clinical interaction;   We discussed the use of AI scribe software for clinical note transcription with the patient, who gave verbal consent to proceed. additional Info: This encounter employed state-of-the-art, real-time, collaborative documentation. The patient actively reviewed and assisted in updating their electronic medical record on a shared screen, ensuring transparency and facilitating joint problem-solving for the problem list, overview, and plan. This approach promotes accurate, informed care. The treatment plan was discussed and reviewed in detail, including medication safety, potential side effects, and all patient questions. We confirmed understanding and comfort with the plan. Follow-up instructions were established, including contacting the office for any concerns, returning if symptoms worsen, persist, or new symptoms develop, and precautions for potential emergency department visits.

## 2024-06-29 ENCOUNTER — Ambulatory Visit

## 2024-06-29 VITALS — Ht 71.0 in | Wt 218.0 lb

## 2024-06-29 DIAGNOSIS — Z0000 Encounter for general adult medical examination without abnormal findings: Secondary | ICD-10-CM | POA: Diagnosis not present

## 2024-06-29 NOTE — Progress Notes (Cosign Needed Addendum)
 "  Chief Complaint  Patient presents with   Medicare Wellness     Subjective:   Joseph Cummings is a 69 y.o. male who presents for a Medicare Annual Wellness Visit.  Visit info / Clinical Intake: Medicare Wellness Visit Type:: Subsequent Annual Wellness Visit; Initial Annual Wellness Visit Persons participating in visit and providing information:: patient Medicare Wellness Visit Mode:: Telephone If telephone:: video declined Since this visit was completed virtually, some vitals may be partially provided or unavailable. Missing vitals are due to the limitations of the virtual format.: Unable to obtain vitals - no equipment If Telephone or Video please confirm:: I connected with patient using audio/video enable telemedicine. I verified patient identity with two identifiers, discussed telehealth limitations, and patient agreed to proceed. Patient Location:: home Provider Location:: office Interpreter Needed?: No Pre-visit prep was completed: yes AWV questionnaire completed by patient prior to visit?: no Living arrangements:: lives with spouse/significant other Patient's Overall Health Status Rating: very good Typical amount of pain: none Does pain affect daily life?: no Are you currently prescribed opioids?: no  Dietary Habits and Nutritional Risks How many meals a day?: 3 Eats fruit and vegetables daily?: yes Most meals are obtained by: preparing own meals In the last 2 weeks, have you had any of the following?: none Diabetic:: (!) yes Any non-healing wounds?: no How often do you check your BS?: as needed Would you like to be referred to a Nutritionist or for Diabetic Management? : no  Functional Status Activities of Daily Living (to include ambulation/medication): Independent Ambulation: Independent with device- listed below Home Assistive Devices/Equipment: Eyeglasses Medication Administration: Independent Home Management (perform basic housework or laundry):  Independent Manage your own finances?: yes Primary transportation is: driving Concerns about vision?: no *vision screening is required for WTM* Concerns about hearing?: no  Fall Screening Falls in the past year?: 0 Number of falls in past year: 0 Was there an injury with Fall?: 0 Fall Risk Category Calculator: 0 Patient Fall Risk Level: Low Fall Risk  Fall Risk Patient at Risk for Falls Due to: No Fall Risks Fall risk Follow up: Falls evaluation completed  Home and Transportation Safety: All rugs have non-skid backing?: yes All stairs or steps have railings?: N/A, no stairs Grab bars in the bathtub or shower?: (!) no Have non-skid surface in bathtub or shower?: (!) no Good home lighting?: yes Regular seat belt use?: yes Hospital stays in the last year:: no  Cognitive Assessment Difficulty concentrating, remembering, or making decisions? : no Will 6CIT or Mini Cog be Completed: yes What year is it?: 0 points What month is it?: 0 points Give patient an address phrase to remember (5 components): [address] [place]  About what time is it?: 0 points Count backwards from 20 to 1: 0 points Say the months of the year in reverse: 4 points (pt decided to stop reciting) Repeat the address phrase from earlier: 0 points 6 CIT Score: 4 points  Advance Directives (For Healthcare) Does Patient Have a Medical Advance Directive?: No Would patient like information on creating a medical advance directive?: No - Patient declined  Reviewed/Updated  Reviewed/Updated: Reviewed All (Medical, Surgical, Family, Medications, Allergies, Care Teams, Patient Goals)    Allergies (verified) Sulfa antibiotics and Formaldehyde   Current Medications (verified) Outpatient Encounter Medications as of 06/29/2024  Medication Sig   acetaminophen  (TYLENOL ) 500 MG tablet Take 1 tablet (500 mg total) by mouth every 6 (six) hours as needed.   Ascorbic Acid (VITAMIN C) 1000  MG tablet Take 1,000 mg by mouth  daily.   azithromycin  (ZITHROMAX ) 250 MG tablet Take 2 tablets on day 1, then 1 tablet daily on days 2 through 5   B Complex Vitamins (VITAMIN B COMPLEX) CAPS as directed Orally   cholecalciferol (VITAMIN D3) 25 MCG (1000 UNIT) tablet Take 1,000 Units by mouth daily.   Cholecalciferol 50 MCG (2000 UT) CAPS 1 capsule.   fexofenadine (ALLEGRA) 180 MG tablet Take 180 mg by mouth daily.   fluticasone  (FLONASE ) 50 MCG/ACT nasal spray Place 2 sprays into both nostrils daily.   Garlic 500 MG TABS Take by mouth.   Ginger, Zingiber officinalis, (GINGER PO) Take 300 mg by mouth.   Guggulipid-Black Pepper (GUGLIPID/BIOPERINE PO) Take by mouth.   magnesium 30 MG tablet Take 30 mg by mouth daily.    metFORMIN  (GLUCOPHAGE -XR) 500 MG 24 hr tablet Take 1 tablet (500 mg total) by mouth daily with breakfast.   Omega-3 Fatty Acids  (OMEGA 3 FISH OIL  PO) Take by mouth.   rosuvastatin  (CRESTOR ) 10 MG tablet 1 tablet Orally Once a day; Duration: 90 days   Saline (SIMPLY SALINE) 0.9 % AERS Place 2 each into the nose as directed. Use nightly for sinus hygiene long-term.  Can also be used as many times daily as desired to assist with clearing congested sinuses.   tamsulosin  (FLOMAX ) 0.4 MG CAPS capsule Take 1 capsule (0.4 mg total) by mouth daily.   triamcinolone  ointment (KENALOG ) 0.1 % Apply 1 application topically 2 (two) times daily. Below the neck up to 3 weeks at a time.   Turmeric 1053 MG TABS Take 1 tablet by mouth daily at 6 (six) AM.   vitamin E  180 MG (400 UNITS) capsule 1 capsule.   Zinc  Citrate-Phytase 25-500 MG CAPS Take 1 capsule by mouth daily.   albuterol  (VENTOLIN  HFA) 108 (90 Base) MCG/ACT inhaler Inhale 2 puffs into the lungs every 6 (six) hours as needed for wheezing or shortness of breath.   benzonatate  (TESSALON ) 100 MG capsule Take 1 capsule (100 mg total) by mouth 2 (two) times daily as needed for cough.   Continuous Glucose Sensor (FREESTYLE LIBRE 3 PLUS SENSOR) MISC Change sensor every 15  days. (Patient not taking: Reported on 06/29/2024)   neomycin-polymyxin-hydrocortisone  (CORTISPORIN) OTIC solution Place 3 drops into both ears 2 (two) times daily. (Patient not taking: Reported on 05/22/2024)   Omega-3 Fatty Acids  (EPA PO) Take by mouth.   pseudoephedrine  (SUDAFED 12 HOUR) 120 MG 12 hr tablet Take 1 tablet (120 mg total) by mouth 2 (two) times daily.   rosuvastatin  (CRESTOR ) 20 MG tablet Take 1 tablet (20 mg total) by mouth daily. (Patient not taking: Reported on 06/29/2024)   [DISCONTINUED] budesonide -formoterol  (SYMBICORT ) 160-4.5 MCG/ACT inhaler Inhale 2 puffs into the lungs 2 (two) times daily.   [DISCONTINUED] Docosahexaenoic Acid (DHA ALGAL-900 PO) Take by mouth.   [DISCONTINUED] rosuvastatin  (CRESTOR ) 10 MG tablet Take 1 tablet (10 mg total) by mouth daily.   No facility-administered encounter medications on file as of 06/29/2024.    History: Past Medical History:  Diagnosis Date   Arthritis    Diabetes mellitus (HCC) 01/2018   07/2016 PREDIABETES: Fasting gluc 120.  HbA1c 6.1%.  Then A1c 6.5% 01/2018:  pt declined nutritionist referral and my recommendation to start metformin  01/20/18.   Fracture of metacarpal bone 10/12/2021   History of kidney stones    Hypertriglyceridemia 07/2016   mild   Past Surgical History:  Procedure Laterality Date   KIDNEY STONE SURGERY  ureteroscopy with extraction approx 2015   Family History  Problem Relation Age of Onset   Hyperlipidemia Mother    Hypertension Mother    Arthritis Father    Hyperlipidemia Father    Hypertension Father    Heart disease Father    Stroke Neg Hx    Diabetes Neg Hx    Cancer Neg Hx    Social History   Occupational History   Not on file  Tobacco Use   Smoking status: Former    Current packs/day: 0.00    Average packs/day: 1 pack/day for 10.0 years (10.0 ttl pk-yrs)    Types: Cigarettes    Start date: 06/18/1981    Quit date: 06/19/1991    Years since quitting: 33.0   Smokeless tobacco:  Never  Vaping Use   Vaping status: Never Used  Substance and Sexual Activity   Alcohol use: Not Currently    Comment: occasionally   Drug use: No   Sexual activity: Not on file   Tobacco Counseling Counseling given: Not Answered  SDOH Screenings   Food Insecurity: No Food Insecurity (06/29/2024)  Housing: Low Risk (06/29/2024)  Transportation Needs: No Transportation Needs (06/29/2024)  Utilities: Not At Risk (06/29/2024)  Alcohol Screen: Low Risk (10/30/2023)  Depression (PHQ2-9): Low Risk (06/29/2024)  Financial Resource Strain: Low Risk (06/29/2024)  Physical Activity: Insufficiently Active (06/29/2024)  Social Connections: Moderately Integrated (06/29/2024)  Stress: No Stress Concern Present (06/29/2024)  Tobacco Use: Medium Risk (06/29/2024)  Health Literacy: Adequate Health Literacy (06/29/2024)   See flowsheets for full screening details  Depression Screen PHQ 2 & 9 Depression Scale- Over the past 2 weeks, how often have you been bothered by any of the following problems? Little interest or pleasure in doing things: 0 Feeling down, depressed, or hopeless (PHQ Adolescent also includes...irritable): 0 PHQ-2 Total Score: 0 Trouble falling or staying asleep, or sleeping too much: 0 Feeling tired or having little energy: 0 Poor appetite or overeating (PHQ Adolescent also includes...weight loss): 0 Feeling bad about yourself - or that you are a failure or have let yourself or your family down: 0 Trouble concentrating on things, such as reading the newspaper or watching television (PHQ Adolescent also includes...like school work): 0 Moving or speaking so slowly that other people could have noticed. Or the opposite - being so fidgety or restless that you have been moving around a lot more than usual: 0 Thoughts that you would be better off dead, or of hurting yourself in some way: 0 PHQ-9 Total Score: 0 If you checked off any problems, how difficult have these problems made it for you to  do your work, take care of things at home, or get along with other people?: Not difficult at all     Goals Addressed               This Visit's Progress     eat right (pt-stated)        Eat right              Objective:    Today's Vitals   06/29/24 1006  Weight: 218 lb (98.9 kg)  Height: 5' 11 (1.803 m)   Body mass index is 30.4 kg/m.  Hearing/Vision screen Hearing Screening - Comments:: Pt denies any hearing issues  Vision Screening - Comments:: Pt stated annual eye exam completed unable to locate provider  Immunizations and Health Maintenance Health Maintenance  Topic Date Due   FOOT EXAM  Never done   OPHTHALMOLOGY EXAM  Never done   DTaP/Tdap/Td (1 - Tdap) Never done   Pneumococcal Vaccine: 50+ Years (1 of 2 - PCV) Never done   Colonoscopy  Never done   Zoster Vaccines- Shingrix (2 of 2) 10/13/2016   COVID-19 Vaccine (1 - 2025-26 season) Never done   Influenza Vaccine  09/15/2024 (Originally 01/17/2024)   HEMOGLOBIN A1C  11/20/2024   Diabetic kidney evaluation - eGFR measurement  05/22/2025   Diabetic kidney evaluation - Urine ACR  05/22/2025   Medicare Annual Wellness (AWV)  06/29/2025   Hepatitis C Screening  Completed   Meningococcal B Vaccine  Aged Out        Assessment/Plan:  This is a routine wellness examination for Joseph Cummings.  Patient Care Team: Jesus Bernardino MATSU, MD as PCP - General (Internal Medicine) Luke Orlan HERO, DO as Consulting Physician (Allergy)  I have personally reviewed and noted the following in the patients chart:   Medical and social history Use of alcohol, tobacco or illicit drugs  Current medications and supplements including opioid prescriptions. Functional ability and status Nutritional status Physical activity Advanced directives List of other physicians Hospitalizations, surgeries, and ER visits in previous 12 months Vitals Screenings to include cognitive, depression, and falls Referrals and appointments  No orders of  the defined types were placed in this encounter.  In addition, I have reviewed and discussed with patient certain preventive protocols, quality metrics, and best practice recommendations. A written personalized care plan for preventive services as well as general preventive health recommendations were provided to patient.   Ellouise VEAR Haws, LPN   [id number]/[id number]   Return in about 53 weeks (around 07/05/2025).  After Visit Summary: (MyChart) Due to this being a telephonic visit, the after visit summary with patients personalized plan was offered to patient via MyChart   Nurse Notes: HM Addressed: Vaccines Due: and discussed  Diabetic Foot Exam recommended Pt stated completed a cologuard was unable to locate. Pt stated completed an eye exam by 4 eyes in [place] unsure of providers name   "

## 2024-06-29 NOTE — Patient Instructions (Signed)
 Mr. Kovalcik,  Thank you for taking the time for your Medicare Wellness Visit. I appreciate your continued commitment to your health goals. Please review the care plan we discussed, and feel free to reach out if I can assist you further.  Please note that Annual Wellness Visits do not include a physical exam. Some assessments may be limited, especially if the visit was conducted virtually. If needed, we may recommend an in-person follow-up with your provider.  Ongoing Care Seeing your primary care provider every 3 to 6 months helps us  monitor your health and provide consistent, personalized care.   Referrals If a referral was made during today's visit and you haven't received any updates within two weeks, please contact the referred provider directly to check on the status.  Recommended Screenings:  Health Maintenance  Topic Date Due   Complete foot exam   Never done   Eye exam for diabetics  Never done   DTaP/Tdap/Td vaccine (1 - Tdap) Never done   Pneumococcal Vaccine for age over 79 (1 of 2 - PCV) Never done   Colon Cancer Screening  Never done   Zoster (Shingles) Vaccine (2 of 2) 10/13/2016   Flu Shot  Never done   COVID-19 Vaccine (1 - 2025-26 season) Never done   Hemoglobin A1C  11/20/2024   Yearly kidney function blood test for diabetes  05/22/2025   Yearly kidney health urinalysis for diabetes  05/22/2025   Medicare Annual Wellness Visit  06/29/2025   Hepatitis C Screening  Completed   Meningitis B Vaccine  Aged Out       06/29/2024   10:15 AM  Advanced Directives  Does Patient Have a Medical Advance Directive? No  Would patient like information on creating a medical advance directive? No - Patient declined    Vision: Annual vision screenings are recommended for early detection of glaucoma, cataracts, and diabetic retinopathy. These exams can also reveal signs of chronic conditions such as diabetes and high blood pressure.  Dental: Annual dental screenings help detect  early signs of oral cancer, gum disease, and other conditions linked to overall health, including heart disease and diabetes.  Please see the attached documents for additional preventive care recommendations.

## 2025-07-01 ENCOUNTER — Ambulatory Visit
